# Patient Record
Sex: Female | Born: 1990
Health system: Southern US, Community
[De-identification: ages and names within clinical notes are randomized; demographics above are authoritative.]

## PROBLEM LIST (undated history)

## (undated) ENCOUNTER — Emergency Department (HOSPITAL_COMMUNITY): Admission: EM | Disposition: A | Discharge status: Home / Self Care | Payer: Self-pay

## (undated) DIAGNOSIS — E8729 Other acidosis: Secondary | ICD-10-CM

## (undated) DIAGNOSIS — N390 Urinary tract infection, site not specified: Secondary | ICD-10-CM

## (undated) DIAGNOSIS — B009 Herpesviral infection, unspecified: Secondary | ICD-10-CM

## (undated) DIAGNOSIS — E282 Polycystic ovarian syndrome: Secondary | ICD-10-CM

## (undated) DIAGNOSIS — A599 Trichomoniasis, unspecified: Secondary | ICD-10-CM

## (undated) DIAGNOSIS — E872 Acidosis: Secondary | ICD-10-CM

## (undated) HISTORY — PX: NO PAST SURGERIES: SHX2092

---
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
## 2012-05-08 ENCOUNTER — Emergency Department (INDEPENDENT_AMBULATORY_CARE_PROVIDER_SITE_OTHER)
Admission: EM | Admit: 2012-05-08 | Discharge: 2012-05-08 | Disposition: A | Discharge status: Home / Self Care | Payer: Medicaid Other | Source: Home / Self Care

## 2012-05-08 ENCOUNTER — Encounter (HOSPITAL_COMMUNITY): Payer: Self-pay

## 2012-05-08 DIAGNOSIS — N390 Urinary tract infection, site not specified: Secondary | ICD-10-CM

## 2012-05-08 DIAGNOSIS — J029 Acute pharyngitis, unspecified: Secondary | ICD-10-CM

## 2012-05-08 LAB — POCT URINALYSIS DIP (DEVICE)
Bilirubin Urine: NEGATIVE
Glucose, UA: NEGATIVE mg/dL
Ketones, ur: NEGATIVE mg/dL
Nitrite: NEGATIVE

## 2012-05-08 MED ORDER — AMOXICILLIN-POT CLAVULANATE 875-125 MG PO TABS: 1.0000 | ORAL_TABLET | Freq: Two times a day (BID) | ORAL | Status: DC

## 2012-05-08 MED ORDER — AMOXICILLIN 500 MG PO CAPS: 500.0000 mg | ORAL_CAPSULE | Freq: Three times a day (TID) | ORAL | Status: DC

## 2012-05-08 NOTE — Discharge Instructions (Signed)
Plenty of cool fluids. Augmentin for 10 days as directed Cepacol lozenges as needed for soothing relief Ibuprofen 400-600mg  q 6 hours as needed for discomfort

## 2012-05-08 NOTE — ED Provider Notes (Signed)
Medical screening examination/treatment/procedure(s) were performed by resident physician or non-physician practitioner and as supervising physician I was immediately available for consultation/collaboration.   Barkley Bruns MD.    Linna Hoff, MD 05/08/12 [phone]

## 2012-05-08 NOTE — ED Notes (Signed)
Patient states that she has swollen lymph nodes since 10/13 , also c/o urinary sx painful and frequent urination

## 2012-05-08 NOTE — ED Provider Notes (Addendum)
History     CSN: [id number]  Arrival date & time 05/08/12  1422   None     No chief complaint on file.   (Consider location/radiation/quality/duration/timing/severity/associated sxs/prior treatment) HPI Comments: 21 year old female presents with mild intermittent sore throat for one day. She states she feels knots in her throat and it hurts to swallow. She denies fever or chills. After she was discharged for a exudative pharyngitis the nurse came back and told me that she had several other complaints as well. One of which was tingling when she urinates. Denies frequency or dysuria. C/O generalized, vague abdominal discomfort as well as valvular discomfort. According to her past medical history she has a history of HSV and trich.   Past Medical History  Diagnosis Date  . Diabetes mellitus   . PCOS (polycystic ovarian syndrome)   . Urinary tract infection   . Trichimoniasis     No past surgical history on file.  No family history on file.  History  Substance Use Topics  . Smoking status: Never Smoker   . Smokeless tobacco: Not on file  . Alcohol Use: No    OB History    Grav Para Term Preterm Abortions TAB SAB Ect Mult Living                  Review of Systems  Constitutional: Positive for appetite change. Negative for fever, chills, activity change and fatigue.  HENT: Positive for congestion, sore throat, rhinorrhea and postnasal drip. Negative for facial swelling, neck pain and neck stiffness.   Eyes: Negative.   Respiratory: Negative.   Cardiovascular: Negative.   Genitourinary: Positive for dysuria and frequency.  Musculoskeletal: Negative.   Skin: Negative for pallor and rash.  Neurological: Negative.     Allergies  Review of patient's allergies indicates no known allergies.  Home Medications   Current Outpatient Rx  Name Route Sig Dispense Refill  . AMOXICILLIN-POT CLAVULANATE 875-125 MG PO TABS Oral Take 1 tablet by mouth every 12 (twelve) hours. 14  tablet 0  . GLIPIZIDE ER 2.5 MG PO TB24 Oral Take 2.5 mg by mouth daily.    Marland Kitchen METFORMIN HCL 850 MG PO TABS Oral Take 0.5 tablets (425 mg total) by mouth 2 (two) times daily with a meal. 60 tablet 0  . METFORMIN HCL 850 MG PO TABS Oral Take 1 tablet (850 mg total) by mouth 2 (two) times daily with a meal. 60 tablet 0  . METFORMIN HCL 850 MG PO TABS Oral Take 1 tablet (850 mg total) by mouth 2 (two) times daily with a meal. 60 tablet 0  . METRONIDAZOLE 500 MG PO TABS Oral Take 2,000 mg by mouth once. One time dose for trichimonis      BP 152/92  Pulse 93  Temp 98.2 F (36.8 C) (Oral)  Resp 19  SpO2 100%  Physical Exam  ED Course  Procedures (including critical care time)  Labs Reviewed  POCT URINALYSIS DIP (DEVICE) - Abnormal; Notable for the following:    Hgb urine dipstick TRACE (*)     Leukocytes, UA MODERATE (*)  Biochemical Testing Only. Please order routine urinalysis from main lab if confirmatory testing is needed.   All other components within normal limits   No results found.   1. Exudative pharyngitis   2. UTI (lower urinary tract infection)       MDM  Augmentin 875 one twice a day for 10 days. Drink plenty of clear cool liquids for hydration Cepacol lozenges  when necessary for soothing relief. Ibuprofen 400-600 mg every 6 hours when necessary pain The patient was advised that we would be able see her for her sore throat and lymph nodes today and we'll check a urine for urinary tract infections however or other complaints of intermittent abdominal discomfort and vulvovaginal discomfort can be worked up at another time.  Results for orders placed during the hospital encounter of 05/08/12  POCT URINALYSIS DIP (DEVICE)      Component Value Range   Glucose, UA NEGATIVE  NEGATIVE mg/dL   Bilirubin Urine NEGATIVE  NEGATIVE   Ketones, ur NEGATIVE  NEGATIVE mg/dL   Specific Gravity, Urine 1.010  1.005 - 1.030   Hgb urine dipstick TRACE (*) NEGATIVE   pH 5.5  5.0 -  8.0   Protein, ur NEGATIVE  NEGATIVE mg/dL   Urobilinogen, UA 0.2  0.0 - 1.0 mg/dL   Nitrite NEGATIVE  NEGATIVE   Leukocytes, UA MODERATE (*) NEGATIVE         Hayden Rasmussen, NP 05/08/12 1619  Hayden Rasmussen, NP 05/08/12 1621  Hayden Rasmussen, NP 05/08/12 1626  Hayden Rasmussen, NP 05/09/12 2353

## 2012-05-10 NOTE — ED Provider Notes (Signed)
Medical screening examination/treatment/procedure(s) were performed by resident physician or non-physician practitioner and as supervising physician I was immediately available for consultation/collaboration.   Barkley Bruns MD.    Linna Hoff, MD 05/10/12 [phone]

## 2013-01-03 ENCOUNTER — Inpatient Hospital Stay (HOSPITAL_COMMUNITY)
Admission: AD | Admit: 2013-01-03 | Discharge: 2013-01-03 | Disposition: A | Discharge status: Home / Self Care | Payer: Medicaid Other | Source: Ambulatory Visit | Attending: Obstetrics & Gynecology | Admitting: Obstetrics & Gynecology

## 2013-01-03 ENCOUNTER — Encounter (HOSPITAL_COMMUNITY): Payer: Self-pay | Admitting: Family

## 2013-01-03 DIAGNOSIS — B373 Candidiasis of vulva and vagina: Secondary | ICD-10-CM | POA: Insufficient documentation

## 2013-01-03 DIAGNOSIS — Z794 Long term (current) use of insulin: Secondary | ICD-10-CM | POA: Insufficient documentation

## 2013-01-03 DIAGNOSIS — IMO0002 Reserved for concepts with insufficient information to code with codable children: Secondary | ICD-10-CM

## 2013-01-03 DIAGNOSIS — B3731 Acute candidiasis of vulva and vagina: Secondary | ICD-10-CM | POA: Insufficient documentation

## 2013-01-03 DIAGNOSIS — E119 Type 2 diabetes mellitus without complications: Secondary | ICD-10-CM | POA: Insufficient documentation

## 2013-01-03 HISTORY — DX: Acidosis: E87.2

## 2013-01-03 HISTORY — DX: Herpesviral infection, unspecified: B00.9

## 2013-01-03 HISTORY — DX: Other acidosis: E87.29

## 2013-01-03 LAB — WET PREP, GENITAL
Clue Cells Wet Prep HPF POC: NONE SEEN
Trich, Wet Prep: NONE SEEN

## 2013-01-03 LAB — URINALYSIS, ROUTINE W REFLEX MICROSCOPIC
Nitrite: NEGATIVE
Specific Gravity, Urine: <1.005 — ABNORMAL LOW (ref 1.005–1.030)
Urobilinogen, UA: 0.2 mg/dL (ref 0.0–1.0)
pH: 6 (ref 5.0–8.0)

## 2013-01-03 LAB — URINE MICROSCOPIC-ADD ON

## 2013-01-03 MED ORDER — FLUCONAZOLE 150 MG PO TABS
150.0000 mg | ORAL_TABLET | Freq: Once | ORAL | Status: AC
  Administered 2013-01-03: 150 mg via ORAL
  Filled 2013-01-03: qty 1

## 2013-01-03 MED ORDER — INSULIN ASPART PROT & ASPART (70-30 MIX) 100 UNIT/ML ~~LOC~~ SUSP: 22.0000 [IU] | Freq: Every day | SUBCUTANEOUS | Status: DC

## 2013-01-03 MED ORDER — LEVONORGESTREL 1.5 MG PO TABS
1.5000 mg | ORAL_TABLET | Freq: Once | ORAL | Status: AC
  Administered 2013-01-03: 1.5 mg via ORAL
  Filled 2013-01-03: qty 1

## 2013-01-03 MED ORDER — FLUCONAZOLE 150 MG PO TABS: 150.0000 mg | ORAL_TABLET | Freq: Once | ORAL | Status: DC
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
## 2013-01-05 NOTE — MAU Provider Note (Signed)
Attestation of Attending Supervision of Advanced Practitioner (PA/CNM/NP): Evaluation and management procedures were performed by the Advanced Practitioner under my supervision and collaboration.  I have reviewed the Advanced Practitioner's note and chart, and I agree with the management and plan.  Antionne Enrique, MD, FACOG Attending Obstetrician & Gynecologist Faculty Practice, [facility]  

## 2013-01-20 ENCOUNTER — Encounter: Payer: Medicaid Other | Admitting: Obstetrics & Gynecology

## 2014-03-03 ENCOUNTER — Emergency Department (HOSPITAL_COMMUNITY)
Admission: EM | Admit: 2014-03-03 | Discharge: 2014-03-04 | Disposition: A | Discharge status: Home / Self Care | Payer: BC Managed Care – PPO | Attending: Emergency Medicine | Admitting: Emergency Medicine

## 2014-03-03 ENCOUNTER — Encounter (HOSPITAL_COMMUNITY): Payer: Self-pay | Admitting: Emergency Medicine

## 2014-03-03 DIAGNOSIS — Z87440 Personal history of urinary (tract) infections: Secondary | ICD-10-CM | POA: Insufficient documentation

## 2014-03-03 DIAGNOSIS — E119 Type 2 diabetes mellitus without complications: Secondary | ICD-10-CM | POA: Insufficient documentation

## 2014-03-03 DIAGNOSIS — Z79899 Other long term (current) drug therapy: Secondary | ICD-10-CM | POA: Insufficient documentation

## 2014-03-03 DIAGNOSIS — Z3202 Encounter for pregnancy test, result negative: Secondary | ICD-10-CM | POA: Insufficient documentation

## 2014-03-03 DIAGNOSIS — Z8619 Personal history of other infectious and parasitic diseases: Secondary | ICD-10-CM | POA: Insufficient documentation

## 2014-03-03 DIAGNOSIS — R739 Hyperglycemia, unspecified: Secondary | ICD-10-CM

## 2014-03-03 DIAGNOSIS — IMO0002 Reserved for concepts with insufficient information to code with codable children: Secondary | ICD-10-CM | POA: Diagnosis not present

## 2014-03-03 LAB — URINALYSIS, ROUTINE W REFLEX MICROSCOPIC
Bilirubin Urine: NEGATIVE
Hgb urine dipstick: NEGATIVE
Ketones, ur: NEGATIVE mg/dL
Nitrite: NEGATIVE
PROTEIN: NEGATIVE mg/dL
Specific Gravity, Urine: 1.03 (ref 1.005–1.030)
UROBILINOGEN UA: 0.2 mg/dL (ref 0.0–1.0)
pH: 5.5 (ref 5.0–8.0)

## 2014-03-03 LAB — CBC
HCT: 37.1 % (ref 36.0–46.0)
Hemoglobin: 12.5 g/dL (ref 12.0–15.0)
MCH: 26.6 pg (ref 26.0–34.0)
MCHC: 33.7 g/dL (ref 30.0–36.0)
MCV: 78.9 fL (ref 78.0–100.0)
PLATELETS: 220 10*3/uL (ref 150–400)
RBC: 4.7 MIL/uL (ref 3.87–5.11)
RDW: 12.7 % (ref 11.5–15.5)
WBC: 7.9 10*3/uL (ref 4.0–10.5)

## 2014-03-03 LAB — URINE MICROSCOPIC-ADD ON

## 2014-03-03 LAB — CBG MONITORING, ED: GLUCOSE-CAPILLARY: 517 mg/dL — AB (ref 70–99)

## 2014-03-03 MED ORDER — SODIUM CHLORIDE 0.9 % IV BOLUS (SEPSIS)
1000.0000 mL | Freq: Once | INTRAVENOUS | Status: AC
  Administered 2014-03-03: 1000 mL via INTRAVENOUS

## 2014-03-03 NOTE — ED Notes (Signed)
Pt states she stopped taking her insulin in Feb d/t financial and insurance concerns. Pt went to [facility] service who referred her here for hyperglycemia. CBG=412 at [facility].

## 2014-03-03 NOTE — ED Notes (Signed)
Bed: QR97 Expected date:  Expected time:  Means of arrival:  Comments: Triage 1 - CBG=517

## 2014-03-04 LAB — COMPREHENSIVE METABOLIC PANEL
ALT: 13 U/L (ref 0–35)
ANION GAP: 16 — AB (ref 5–15)
AST: 16 U/L (ref 0–37)
Albumin: 4.2 g/dL (ref 3.5–5.2)
Alkaline Phosphatase: 51 U/L (ref 39–117)
BUN: 10 mg/dL (ref 6–23)
CALCIUM: 10 mg/dL (ref 8.4–10.5)
CO2: 22 meq/L (ref 19–32)
CREATININE: 0.74 mg/dL (ref 0.50–1.10)
Chloride: 96 mEq/L (ref 96–112)
Glucose, Bld: 577 mg/dL (ref 70–99)
Potassium: 4.2 mEq/L (ref 3.7–5.3)
Sodium: 134 mEq/L — ABNORMAL LOW (ref 137–147)
Total Bilirubin: 0.3 mg/dL (ref 0.3–1.2)
Total Protein: 8.2 g/dL (ref 6.0–8.3)

## 2014-03-04 LAB — BLOOD GAS, VENOUS
Acid-base deficit: 1.5 mmol/L (ref 0.0–2.0)
Bicarbonate: 24.5 mEq/L — ABNORMAL HIGH (ref 20.0–24.0)
FIO2: 0.21 %
O2 SAT: 50.1 %
PATIENT TEMPERATURE: 99
TCO2: 22.6 mmol/L (ref 0–100)
pCO2, Ven: 49.7 mmHg (ref 45.0–50.0)
pH, Ven: 7.316 — ABNORMAL HIGH (ref 7.250–7.300)
pO2, Ven: 30.3 mmHg (ref 30.0–45.0)

## 2014-03-04 LAB — CBG MONITORING, ED
GLUCOSE-CAPILLARY: 181 mg/dL — AB (ref 70–99)
Glucose-Capillary: 478 mg/dL — ABNORMAL HIGH (ref 70–99)

## 2014-03-04 MED ORDER — INSULIN ASPART 100 UNIT/ML ~~LOC~~ SOLN
SUBCUTANEOUS | Status: AC
  Filled 2014-03-04: qty 1

## 2014-03-04 MED ORDER — SODIUM CHLORIDE 0.9 % IV BOLUS (SEPSIS): 1000.0000 mL | Freq: Once | INTRAVENOUS | Status: DC

## 2014-03-04 NOTE — ED Provider Notes (Signed)
Darlene Watts S 1:00 AM patient discussed in sign out. Patient with previous history of diabetes presenting with concerns for elevated blood sugar. Patient has not been on her regular insulin due to lack of insurance. Was sent from [facility] with concern and worry of elevated blood sugar. Blood sugar here over 500. No signs of DKA. Treatment initiated we will plan to recheck blood sugars and if improved may discharge home with prescriptions for her normal NovoLog insulin.   Blood sugars have improved significantly and are now 181. Patient continues to be without significant symptoms. She may be discharged at this time.  Martie Lee, PA-C 03/04/14 [phone]

## 2014-03-04 NOTE — ED Notes (Signed)
Patient discharged during downtime. See downtime documentation.

## 2014-03-04 NOTE — ED Provider Notes (Signed)
Medical screening examination/treatment/procedure(s) were performed by non-physician practitioner and as supervising physician I was immediately available for consultation/collaboration.   Delora Fuel, MD 29/52/84 1324

## 2014-03-04 NOTE — ED Provider Notes (Signed)
CSN: [id number]     Arrival date & time 03/03/14  2159 History   First MD Initiated Contact with Patient 03/03/14 2243     Chief Complaint  Patient presents with  . Hyperglycemia     (Consider location/radiation/quality/duration/timing/severity/associated sxs/prior Treatment) The history is provided by the patient and medical records.   This is a 23 y.o. F with hx of DM2, HSV2, PCOS, presenting to the ED for hyperglycemia.  Patient states she has been off of her insulin for the past 6 months, was previously on novo-log 70-30, but could no longer afford medications when she lost her insurance.  States when taking her insulin, her blood sugar was well controlled.  Recently she has been experiencing excessive thirst and frequent urination without dysuria.  She denies fever, chills, nausea, vomiting, diarrhea.  She has had frequent yeast infections since being off medications, [facility].  Her blood sugar was checked at that time, found to be 412 thus she was sent to the ED for further evaluation. VS stable on arrival.  Past Medical History  Diagnosis Date  . PCOS (polycystic ovarian syndrome)   . Urinary tract infection   . Trichimoniasis   . Diabetes mellitus     Type 2 - Novolog 70/30  . Herpes simplex type 2 infection March 2013  . Ketoacidosis    Past Surgical History  Procedure Laterality Date  . No past surgeries     No family history on file. History  Substance Use Topics  . Smoking status: Never Smoker   . Smokeless tobacco: Never Used  . Alcohol Use: No   OB History   Grav Para Term Preterm Abortions TAB SAB Ect Mult Living                 Review of Systems  Endocrine: Positive for polydipsia and polyuria.  All other systems reviewed and are negative.     Allergies  Review of patient's allergies indicates no known allergies.  Home Medications   Prior to Admission medications   Medication  Sig Start Date End Date Taking? Authorizing Provider  fluticasone (FLONASE) 50 MCG/ACT nasal spray Place 2 sprays into both nostrils daily.   Yes Historical Provider, MD  loratadine (CLARITIN) 10 MG tablet Take 10 mg by mouth daily.   Yes Historical Provider, MD  miconazole (MICOTIN) 2 % powder Apply 1 application topically daily as needed for itching.   Yes Historical Provider, MD   BP 120/69  Pulse 78  Temp(Src) 99 F (37.2 C) (Oral)  Resp 16  SpO2 100%  LMP 02/19/2014  Physical Exam  Nursing note and vitals reviewed. Constitutional: She is oriented to person, place, and time. She appears well-developed and well-nourished. No distress.  NAD, currently drinking water  HENT:  Head: Normocephalic and atraumatic.  Mouth/Throat: Oropharynx is clear and moist.  Eyes: Conjunctivae and EOM are normal. Pupils are equal, round, and reactive to light.  Neck: Normal range of motion. Neck supple.  Cardiovascular: Normal rate, regular rhythm and normal heart sounds.   Pulmonary/Chest: Effort normal and breath sounds normal. No respiratory distress. She has no wheezes.  Abdominal: Soft. Bowel sounds are normal. There is no tenderness. There is no guarding.  Musculoskeletal: Normal range of motion.  Neurological: She is alert and oriented to person, place, and time.  Skin: Skin is warm and dry. She is not diaphoretic.  Psychiatric: She has a normal mood and affect.  ED Course  Procedures (including critical care time) Labs Review Labs Reviewed  COMPREHENSIVE METABOLIC PANEL - Abnormal; Notable for the following:    Sodium 134 (*)    Glucose, Bld 577 (*)    Anion gap 16 (*)    All other components within normal limits  URINALYSIS, ROUTINE W REFLEX MICROSCOPIC - Abnormal; Notable for the following:    Glucose, UA >1000 (*)    Leukocytes, UA TRACE (*)    All other components within normal limits  BLOOD GAS, VENOUS - Abnormal; Notable for the following:    pH, Ven 7.316 (*)     Bicarbonate 24.5 (*)    All other components within normal limits  CBG MONITORING, ED - Abnormal; Notable for the following:    Glucose-Capillary 517 (*)    All other components within normal limits  CBG MONITORING, ED - Abnormal; Notable for the following:    Glucose-Capillary 478 (*)    All other components within normal limits  CBG MONITORING, ED - Abnormal; Notable for the following:    Glucose-Capillary 181 (*)    All other components within normal limits  CBC  URINE MICROSCOPIC-ADD ON  POC URINE PREG, ED    Imaging Review No results found.   EKG Interpretation None      MDM   Final diagnoses:  Hyperglycemia   23 y.o. Type 2 diabetic off insulin for past several months, presenting with hyperglycemia.  CBG on arrival was 517.  Patient awake, alert, currently drinking water in NAD.  Labs were obtained, anion gap of 16, bicarb WNL, no ketones present in urine-- clinically not DKA.  Patient given 1L of fluid with CBG  Improved to 478.  12 units insulin ordered and second liter infusing.    Care signed out to PA Dammen at shift change.  Feel patient can be discharged home with Rx for home insulin (was on 15-20 units of novo-log 70-30) once CBG controlled.  Larene Pickett, PA-C 03/04/14 1117

## 2014-03-07 NOTE — ED Provider Notes (Signed)
Medical screening examination/treatment/procedure(s) were performed by non-physician practitioner and as supervising physician I was immediately available for consultation/collaboration.   EKG Interpretation None        Debby Freiberg, MD 03/07/14 1505

## 2014-09-14 ENCOUNTER — Encounter (HOSPITAL_COMMUNITY): Payer: Self-pay | Admitting: *Deleted

## 2014-09-14 ENCOUNTER — Emergency Department (HOSPITAL_COMMUNITY)
Admission: EM | Admit: 2014-09-14 | Discharge: 2014-09-14 | Disposition: A | Discharge status: Home / Self Care | Payer: BLUE CROSS/BLUE SHIELD | Attending: Emergency Medicine | Admitting: Emergency Medicine

## 2014-09-14 DIAGNOSIS — Z3202 Encounter for pregnancy test, result negative: Secondary | ICD-10-CM | POA: Insufficient documentation

## 2014-09-14 DIAGNOSIS — Z880 Allergy status to penicillin: Secondary | ICD-10-CM | POA: Insufficient documentation

## 2014-09-14 DIAGNOSIS — E1165 Type 2 diabetes mellitus with hyperglycemia: Secondary | ICD-10-CM | POA: Insufficient documentation

## 2014-09-14 DIAGNOSIS — Z79899 Other long term (current) drug therapy: Secondary | ICD-10-CM | POA: Diagnosis not present

## 2014-09-14 DIAGNOSIS — Z8619 Personal history of other infectious and parasitic diseases: Secondary | ICD-10-CM | POA: Insufficient documentation

## 2014-09-14 DIAGNOSIS — N390 Urinary tract infection, site not specified: Secondary | ICD-10-CM

## 2014-09-14 DIAGNOSIS — R739 Hyperglycemia, unspecified: Secondary | ICD-10-CM

## 2014-09-14 DIAGNOSIS — R300 Dysuria: Secondary | ICD-10-CM | POA: Diagnosis present

## 2014-09-14 LAB — CBC
HEMATOCRIT: 40.2 % (ref 36.0–46.0)
Hemoglobin: 13.8 g/dL (ref 12.0–15.0)
MCH: 27.2 pg (ref 26.0–34.0)
MCHC: 34.3 g/dL (ref 30.0–36.0)
MCV: 79.1 fL (ref 78.0–100.0)
Platelets: 257 10*3/uL (ref 150–400)
RBC: 5.08 MIL/uL (ref 3.87–5.11)
RDW: 12.3 % (ref 11.5–15.5)
WBC: 8.1 10*3/uL (ref 4.0–10.5)

## 2014-09-14 LAB — URINALYSIS, ROUTINE W REFLEX MICROSCOPIC
Bilirubin Urine: NEGATIVE
KETONES UR: 40 mg/dL — AB
Nitrite: NEGATIVE
Protein, ur: NEGATIVE mg/dL
SPECIFIC GRAVITY, URINE: 1.026 (ref 1.005–1.030)
Urobilinogen, UA: 0.2 mg/dL (ref 0.0–1.0)
pH: 5.5 (ref 5.0–8.0)

## 2014-09-14 LAB — COMPREHENSIVE METABOLIC PANEL
ALT: 13 U/L (ref 0–35)
AST: 12 U/L (ref 0–37)
Albumin: 4.5 g/dL (ref 3.5–5.2)
Alkaline Phosphatase: 62 U/L (ref 39–117)
Anion gap: 15 (ref 5–15)
BILIRUBIN TOTAL: 1.1 mg/dL (ref 0.3–1.2)
BUN: 13 mg/dL (ref 6–23)
CHLORIDE: 92 mmol/L — AB (ref 96–112)
CO2: 26 mmol/L (ref 19–32)
CREATININE: 0.83 mg/dL (ref 0.50–1.10)
Calcium: 10.2 mg/dL (ref 8.4–10.5)
GFR calc Af Amer: >90 mL/min (ref >90)
GFR calc non Af Amer: >90 mL/min (ref >90)
Glucose, Bld: 471 mg/dL — ABNORMAL HIGH (ref 70–99)
Potassium: 4.6 mmol/L (ref 3.5–5.1)
SODIUM: 133 mmol/L — AB (ref 135–145)
Total Protein: 8.7 g/dL — ABNORMAL HIGH (ref 6.0–8.3)

## 2014-09-14 LAB — BLOOD GAS, VENOUS
ACID-BASE EXCESS: 2.6 mmol/L — AB (ref 0.0–2.0)
BICARBONATE: 28.3 meq/L — AB (ref 20.0–24.0)
FIO2: 0.21 %
O2 SAT: 38.4 %
PATIENT TEMPERATURE: 98.6
PCO2 VEN: 50 mmHg (ref 45.0–50.0)
TCO2: 25.5 mmol/L (ref 0–100)
pH, Ven: 7.372 — ABNORMAL HIGH (ref 7.250–7.300)

## 2014-09-14 LAB — URINE MICROSCOPIC-ADD ON

## 2014-09-14 LAB — CBG MONITORING, ED
GLUCOSE-CAPILLARY: 369 mg/dL — AB (ref 70–99)
Glucose-Capillary: 515 mg/dL — ABNORMAL HIGH (ref 70–99)
Glucose-Capillary: 333 mg/dL — ABNORMAL HIGH (ref 70–99)
Glucose-Capillary: 274 mg/dL — ABNORMAL HIGH (ref 70–99)

## 2014-09-14 LAB — POC URINE PREG, ED: Preg Test, Ur: NEGATIVE

## 2014-09-14 MED ORDER — HYDROMORPHONE HCL 1 MG/ML IJ SOLN
1.0000 mg | Freq: Once | INTRAMUSCULAR | Status: AC
  Administered 2014-09-14: 1 mg via INTRAVENOUS
  Filled 2014-09-14: qty 1

## 2014-09-14 MED ORDER — SODIUM CHLORIDE 0.9 % IV BOLUS (SEPSIS)
1000.0000 mL | Freq: Once | INTRAVENOUS | Status: AC
  Administered 2014-09-14: 1000 mL via INTRAVENOUS

## 2014-09-14 MED ORDER — PHENAZOPYRIDINE HCL 200 MG PO TABS: 200.0000 mg | ORAL_TABLET | Freq: Three times a day (TID) | ORAL | Status: DC

## 2014-09-14 MED ORDER — METOCLOPRAMIDE HCL 5 MG/ML IJ SOLN
10.0000 mg | Freq: Once | INTRAMUSCULAR | Status: AC
  Administered 2014-09-14: 10 mg via INTRAVENOUS
  Filled 2014-09-14: qty 2

## 2014-09-14 MED ORDER — ONDANSETRON HCL 4 MG PO TABS: 4.0000 mg | ORAL_TABLET | Freq: Four times a day (QID) | ORAL | Status: DC

## 2014-09-14 MED ORDER — CIPROFLOXACIN IN D5W 400 MG/200ML IV SOLN
400.0000 mg | Freq: Once | INTRAVENOUS | Status: AC
  Administered 2014-09-14: 400 mg via INTRAVENOUS
  Filled 2014-09-14: qty 200

## 2014-09-14 MED ORDER — SODIUM CHLORIDE 0.9 % IV SOLN
INTRAVENOUS | Status: DC
  Administered 2014-09-14: 13:00:00 via INTRAVENOUS
  Filled 2014-09-14: qty 2.5

## 2014-09-14 MED ORDER — CIPROFLOXACIN HCL 500 MG PO TABS: 500.0000 mg | ORAL_TABLET | Freq: Two times a day (BID) | ORAL | Status: DC

## 2014-09-14 NOTE — ED Notes (Signed)
Per Dr Olevia Bowens at [facility] has been self-medicating with Metformin-noncompliant with DM treatment/management-seen in his office today-for posible pyelo and a CBG of 512-patient symptomatic, c/o back pain, dysuria, lethargy

## 2014-09-14 NOTE — ED Provider Notes (Signed)
CSN: [id number]     Arrival date & time 09/14/14  1027 History   First MD Initiated Contact with Patient 09/14/14 1116     Chief Complaint  Patient presents with  . eval for pyelonephritis and DKA      (Consider location/radiation/quality/duration/timing/severity/associated sxs/prior Treatment) HPI  Pt presenting with c/o elevated blood glucose and dysuria.  She has hx of DM and states she has been taking only metformin for the past 6-8 months as she could not afford her insulin and had lost insurance.  She states that approx 2 days ago she began having some dysuria and stinging with urination.  She checked her glucose and it has been ranging 200-400.  States she hadn't checked prior to this because she was feeling well.  No fever/chills.  Had one episode of emesis prior to arrival.  Was seen at her PMD office and referred to the ED due to concern for UTI in the setting of DKA.  Pt denies back pain, no abdominal pain.  There are no other associated systemic symptoms, there are no other alleviating or modifying factors.   Past Medical History  Diagnosis Date  . PCOS (polycystic ovarian syndrome)   . Urinary tract infection   . Trichimoniasis   . Diabetes mellitus     Type 2 - Novolog 70/30  . Herpes simplex type 2 infection March 2013  . Ketoacidosis    Past Surgical History  Procedure Laterality Date  . No past surgeries     History reviewed. No pertinent family history. History  Substance Use Topics  . Smoking status: Never Smoker   . Smokeless tobacco: Never Used  . Alcohol Use: No   OB History    No data available     Review of Systems  ROS reviewed and all otherwise negative except for mentioned in HPI    Allergies  Amoxicillin  Home Medications   Prior to Admission medications   Medication Sig Start Date End Date Taking? Authorizing Provider  Ascorbic Acid (VITAMIN C PO) Take 3-4 tablets by mouth daily as needed (For cold symptoms.).   Yes Historical Provider,  MD  loratadine (CLARITIN) 10 MG tablet Take 10 mg by mouth daily as needed for allergies.    Yes Historical Provider, MD  metFORMIN (GLUCOPHAGE) 1000 MG tablet Take 1,000 mg by mouth 2 (two) times daily with a meal.   Yes Historical Provider, MD  miconazole (MICOTIN) 2 % powder Apply 1 application topically 2 (two) times daily.    Yes Historical Provider, MD  ciprofloxacin (CIPRO) 500 MG tablet Take 1 tablet (500 mg total) by mouth 2 (two) times daily. 09/14/14   Threasa Beards, MD  ondansetron (ZOFRAN) 4 MG tablet Take 1 tablet (4 mg total) by mouth every 6 (six) hours. 09/14/14   Threasa Beards, MD  phenazopyridine (PYRIDIUM) 200 MG tablet Take 1 tablet (200 mg total) by mouth 3 (three) times daily. 09/14/14   Threasa Beards, MD   BP 132/78 mmHg  Pulse 91  Temp(Src) 98.2 F (36.8 C) (Oral)  Resp 16  SpO2 100%  Vitals reviewed Physical Exam  Physical Examination: General appearance - alert, well appearing, and in no distress Mental status - alert, oriented to person, place, and time Eyes - no conjunctival injection, no scleral icterus Mouth - mucous membranes tacky, OP clear Chest - clear to auscultation, no wheezes, rales or rhonchi, symmetric air entry Heart - normal rate, regular rhythm, normal S1, S2, no murmurs, rubs, clicks  or gallops Abdomen - soft, nontender, nondistended, no masses or organomegaly, nabs Back exam - full range of motion, no tenderness, palpable spasm or pain on motion Extremities - peripheral pulses normal, no pedal edema, no clubbing or cyanosis Skin - normal coloration and turgor, no rashes  ED Course  Procedures (including critical care time)  11:37 AM went to see patient, not in room Labs Review Labs Reviewed  COMPREHENSIVE METABOLIC PANEL - Abnormal; Notable for the following:    Sodium 133 (*)    Chloride 92 (*)    Glucose, Bld 471 (*)    Total Protein 8.7 (*)    All other components within normal limits  URINALYSIS, ROUTINE W REFLEX MICROSCOPIC  - Abnormal; Notable for the following:    APPearance CLOUDY (*)    Glucose, UA >1000 (*)    Hgb urine dipstick SMALL (*)    Ketones, ur 40 (*)    Leukocytes, UA MODERATE (*)    All other components within normal limits  BLOOD GAS, VENOUS - Abnormal; Notable for the following:    pH, Ven 7.372 (*)    Bicarbonate 28.3 (*)    Acid-Base Excess 2.6 (*)    All other components within normal limits  URINE MICROSCOPIC-ADD ON - Abnormal; Notable for the following:    Bacteria, UA FEW (*)    All other components within normal limits  CBG MONITORING, ED - Abnormal; Notable for the following:    Glucose-Capillary 515 (*)    All other components within normal limits  CBG MONITORING, ED - Abnormal; Notable for the following:    Glucose-Capillary 369 (*)    All other components within normal limits  CBG MONITORING, ED - Abnormal; Notable for the following:    Glucose-Capillary 333 (*)    All other components within normal limits  CBG MONITORING, ED - Abnormal; Notable for the following:    Glucose-Capillary 274 (*)    All other components within normal limits  CBG MONITORING, ED - Abnormal; Notable for the following:    Glucose-Capillary 333 (*)    All other components within normal limits  URINE CULTURE  CBC  POC URINE PREG, ED    Imaging Review No results found.   EKG Interpretation None      MDM   Final diagnoses:  UTI (lower urinary tract infection)  Hyperglycemia    Pt presenting with c/o dysuria, blood sugar elevated.  She is not in DKA based on labwork, she is hyperglcyemic.  Pt treated with insulin to correct her gulcose.  Started on cipro for UTI- hx of hives with amox so started on cipro.  Urine culture sent.  Discharged with strict return precautions.  Pt agreeable with plan.  She will followup with student health at [facility]- she will work with them to get set up with an endocrinologist.  Pt has tolerated po fluids in the ED.     Threasa Beards, MD 09/16/14 1536

## 2014-09-14 NOTE — ED Notes (Addendum)
Pt reports seen at doctors office, hx of DM, has not taken insulin for DM in 8 months due to lapse in insurance. Reports dysuria x5 days. Pain 7/10. Hx of admission for DKA. Results show UA: 3+ leuks, positive nitrates, 40 ketones, 2000 glucose, micro TNTC WBCS. Urine hcg negative. Cbc with WBC #9.2, random blood glucose 512, HCB A1C> 14. "concern for DKA in setting of pyelonephritis."

## 2014-09-14 NOTE — Discharge Instructions (Signed)
Return to the ED with any concerns including vomiting and not able to keep down liquids, fever/chills, fainting, shortness of breath, decreased level of alertness/lethargy, or any other alarming symptoms  You should be sure to check your blood sugar regularly, especially while you have the urinary tract infection

## 2014-09-14 NOTE — ED Notes (Signed)
Pt is aware that a urine sample is needed.  

## 2014-09-15 LAB — CBG MONITORING, ED: Glucose-Capillary: 333 mg/dL — ABNORMAL HIGH (ref 70–99)

## 2014-09-17 LAB — URINE CULTURE

## 2014-09-19 ENCOUNTER — Telehealth: Payer: Self-pay | Admitting: *Deleted

## 2014-09-19 NOTE — ED Notes (Unsigned)
(+)  urine culture, treated with Cipro, no further treatment, J. Judie Bonus, Pharm

## 2014-10-06 ENCOUNTER — Ambulatory Visit: Payer: BLUE CROSS/BLUE SHIELD | Admitting: Internal Medicine

## 2014-10-30 ENCOUNTER — Ambulatory Visit (INDEPENDENT_AMBULATORY_CARE_PROVIDER_SITE_OTHER): Payer: BLUE CROSS/BLUE SHIELD | Admitting: Internal Medicine

## 2014-10-30 ENCOUNTER — Encounter: Payer: Self-pay | Admitting: Internal Medicine

## 2014-10-30 VITALS — BP 112/78 | HR 83 | Temp 97.6°F | Resp 12 | Ht 62.5 in | Wt 174.0 lb

## 2014-10-30 DIAGNOSIS — E1310 Other specified diabetes mellitus with ketoacidosis without coma: Secondary | ICD-10-CM

## 2014-10-30 DIAGNOSIS — E1165 Type 2 diabetes mellitus with hyperglycemia: Secondary | ICD-10-CM | POA: Insufficient documentation

## 2014-10-30 DIAGNOSIS — E1110 Type 2 diabetes mellitus with ketoacidosis without coma: Secondary | ICD-10-CM

## 2014-10-30 LAB — LIPID PANEL
Cholesterol: 150 mg/dL (ref 0–200)
HDL: 35.5 mg/dL — ABNORMAL LOW (ref >39.00)
LDL Cholesterol: 91 mg/dL (ref 0–99)
NONHDL: 114.5
Total CHOL/HDL Ratio: 4
Triglycerides: 116 mg/dL (ref 0.0–149.0)
VLDL: 23.2 mg/dL (ref 0.0–40.0)

## 2014-10-30 LAB — MICROALBUMIN / CREATININE URINE RATIO
Creatinine,U: 55.5 mg/dL
MICROALB/CREAT RATIO: 1.3 mg/g (ref 0.0–30.0)

## 2014-10-30 LAB — HEMOGLOBIN A1C: Hgb A1c MFr Bld: 13.8 % — ABNORMAL HIGH (ref 4.6–6.5)

## 2014-10-30 MED ORDER — GLIPIZIDE 10 MG PO TABS: 10.0000 mg | ORAL_TABLET | Freq: Two times a day (BID) | ORAL | Status: DC

## 2014-10-30 NOTE — Progress Notes (Signed)
Patient ID: Darlene Watts, female   DOB: 12-23-1990, 24 y.o.   MRN: [id number]  HPI: Darlene Watts is a 24 y.o.-year-old female, referred by her PCP, Dr. Osker Mason [facility]), for management of DM - unclear if 1 or 2, dx in 2011, insulin-dependent, uncontrolled, with complications (DKA in 6433). She was seeing endocrinology in [place] (Dr. Christena Deem).   Last hemoglobin A1c was: - pt does not remember, and records not available  Pt is on a regimen of: - Metformin 1000 mg 2x a day, with meals - Lantus 30 units at bedtime - started 10/15/2014 She was on NovoLog 70/30 before - could not afford it and had to stop - since then changed insurance  Pt checks her sugars 2x a day and they are: - am: 450s before insulin; 200-350 - 2h after b'fast: n/c - before lunch: n/c - 2h after lunch: n/c - before dinner: n/c - at dinner: 200-250 - bedtime: n/c - nighttime: n/c No lows. Lowest sugar was 200; she has hypoglycemia awareness at <120.  Highest sugar was rarely HI.  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   - no CKD, last BUN/creatinine:  Lab Results  Component Value Date   BUN 13 09/14/2014   CREATININE 0.83 09/14/2014   - last set of lipids: No results found for: CHOL, HDL, LDLCALC, LDLDIRECT, TRIG, CHOLHDL - last eye exam was in 2014. No DR.  - no numbness and tingling in her feet.  Pt has FH of DM in mother, MGM, MGF, father.  ROS: Constitutional: + weight loss, + fatigue, no subjective hyperthermia/hypothermia, + excessive urination and nocturia, + poor sleep Eyes: no blurry vision, no xerophthalmia ENT: no sore throat, no nodules palpated in throat, no dysphagia/odynophagia, no hoarseness Cardiovascular: + CP/no  SOB/palpitations/leg swelling Respiratory: no cough/SOB Gastrointestinal: + all: N/V/D/acid reflux, no C Musculoskeletal: no muscle/joint aches Skin: no rashes, + itching Neurological: no tremors/numbness/tingling/dizziness, + HA Psychiatric: no depression/anxiety  Past Medical History  Diagnosis Date  . PCOS (polycystic ovarian syndrome)   . Urinary tract infection   . Trichimoniasis   . Diabetes mellitus     Type 2 - Novolog 70/30  . Herpes simplex type 2 infection March 2013  . Ketoacidosis    Past Surgical History  Procedure Laterality Date  . No past surgeries     History   Social History  . Marital Status: Single    Spouse Name: N/A  . Number of Children: 0   Occupational History  .    Social History Main Topics  . Smoking status: Never Smoker   . Smokeless tobacco: Never Used  . Alcohol Use: No  . Drug Use: No  . Sexual Activity: Yes    Birth Control/ Protection: None   Current Outpatient Prescriptions on File Prior to Visit  Medication Sig Dispense Refill  . Ascorbic Acid (VITAMIN C PO) Take 3-4 tablets by mouth daily as needed (For cold symptoms.).    Marland Kitchen metFORMIN (GLUCOPHAGE) 1000 MG tablet Take 1,000 mg by mouth 2 (two) times daily with a meal.    . miconazole (MICOTIN) 2 % powder Apply 1 application topically 2 (two) times daily.     . ciprofloxacin (CIPRO)  500 MG tablet Take 1 tablet (500 mg total) by mouth 2 (two) times daily. (Patient not taking: Reported on 10/30/2014) 20 tablet 0  . loratadine (CLARITIN) 10 MG tablet Take 10 mg by mouth daily as needed for allergies.     Marland Kitchen ondansetron (ZOFRAN) 4 MG tablet Take 1 tablet (4 mg total) by mouth every 6 (six) hours. (Patient not taking: Reported on 10/30/2014) 12 tablet 0  . phenazopyridine (PYRIDIUM) 200 MG tablet Take 1 tablet (200 mg total) by mouth 3 (three) times daily. (Patient not taking: Reported on 10/30/2014) 6 tablet 0   No current facility-administered medications on file prior to visit.    Allergies  Allergen Reactions  . Amoxicillin Hives   No family history on file.  PE: BP 112/78 mmHg  Pulse 83  Temp(Src) 97.6 F (36.4 C) (Oral)  Resp 12  Ht 5' 2.5" (1.588 m)  Wt 174 lb (78.926 kg)  BMI 31.30 kg/m2  SpO2 99% Wt Readings from Last 3 Encounters:  10/30/14 174 lb (78.926 kg)  01/03/13 219 lb 2 oz (99.394 kg)  09/21/11 220 lb (99.791 kg) (99 %*, Z = 2.20)   * Growth percentiles are based on CDC 2-20 Years data.   Constitutional: overweight, in NAD Eyes: PERRLA, EOMI, no exophthalmos ENT: moist mucous membranes, no thyromegaly, no cervical lymphadenopathy Cardiovascular: RRR, No MRG Respiratory: CTA B Gastrointestinal: abdomen soft, NT, ND, BS+ Musculoskeletal: no deformities, strength intact in all 4 Skin: moist, warm, no rashes, + acanthosis nigricans on neck Neurological: no tremor with outstretched hands, DTR normal in all 4  ASSESSMENT: 1. DM - unclear if 1 or 2, insulin-dependent, uncontrolled, with complications (DKA)  PLAN:  1. Patient with long-standing, uncontrolled diabetes, on oral antidiabetic regimen + basal insulin, which is not sufficient. I believe she has DM1 - we will check anti-pancreatic antibodies today, but cannot check a C-peptide yet as sugars very high, Will check this at next visit. For now, will increase Lantus, continue Metformin and add Glipizide 2x a day. If no improvement >> needs mealtime insulin, most likely. - We discussed about options for treatment, and I suggested to:  Patient Instructions  Please increase Lantus to 35 and then 40 units at bedtime. Continue Metformin 1000 mg 2x a day with meals. Start Glipizide 10 mg 2x a day, before meals.  Please stop at the lab.  Please return in 1 month with your sugar log.   - continue sugars at different times of the day - check 2-3imes a day, rotating checks - given sugar log and advised how to fill it and to bring it at next appt  - given foot care handout and explained  the principles  - given instructions for hypoglycemia management "15-15 rule"  - advised for yearly eye exams >> needs one! - check hbA1c, Lipids (she is fasting) and ACR today - Return to clinic in 1 mo with sugar log   Office Visit on 10/30/2014  Component Date Value Ref Range Status  . Hgb A1c MFr Bld 10/30/2014 13.8* 4.6 - 6.5 % Final   Glycemic Control Guidelines for People with Diabetes:Non Diabetic:  <6%Goal of Therapy: <7%Additional Action Suggested:  >8%   . Microalb, Ur 10/30/2014 <0.7  0.0 - 1.9 mg/dL Final  . Creatinine,U 10/30/2014 55.5   Final  . Microalb Creat Ratio 10/30/2014 1.3  0.0 - 30.0 mg/g Final  . Cholesterol 10/30/2014 150  0 - 200 mg/dL Final   ATP III Classification  Desirable:  < 200 mg/dL               Borderline High:  200 - 239 mg/dL          High:  > = 240 mg/dL  . Triglycerides 10/30/2014 116.0  0.0 - 149.0 mg/dL Final   Normal:  <150 mg/dLBorderline High:  150 - 199 mg/dL  . HDL 10/30/2014 35.50* >39.00 mg/dL Final  . VLDL 10/30/2014 23.2  0.0 - 40.0 mg/dL Final  . LDL Cholesterol 10/30/2014 91  0 - 99 mg/dL Final  . Total CHOL/HDL Ratio 10/30/2014 4   Final                  Men          Women1/2 Average Risk     3.4          3.3Average Risk          5.0          4.42X Average Risk          9.6          7.13X Average Risk          15.0          11.0                      . NonHDL 10/30/2014 114.50   Final   NOTE:  Non-HDL goal should be 30 mg/dL higher than patient's LDL goal (i.e. LDL goal of < 70 mg/dL, would have non-HDL goal of < 100 mg/dL)   Component     Latest Ref Rng 10/30/2014  Glutamic Acid Decarb Ab     <=1.0 U/mL 3.8 (H)  Pancreatic Islet Cell Antibody     < 5 JDF Units <5   Patient with high GAD antibodies, and normal ICA antibodies level. I will continue her insulin for now along with oral medicines, however, at next visit, we need a C-peptide. If this is normal or high, I will consider that she has ketone prone diabetes and continue  to manage her with the current regimen. If her C-peptide is low, I will consider that she has type 1 diabetes, and we'll need to start mealtime insulin.

## 2014-10-30 NOTE — Patient Instructions (Signed)
Please increase Lantus to 35 and then 40 units at bedtime. Continue Metformin 1000 mg 2x a day with meals. Start Glipizide 10 mg 2x a day, before meals.  Please stop at the lab.  Please return in 1 month with your sugar log.   PATIENT INSTRUCTIONS FOR TYPE 2 DIABETES:  **Please join MyChart!** - see attached instructions about how to join if you have not done so already.  DIET AND EXERCISE Diet and exercise is an important part of diabetic treatment.  We recommended aerobic exercise in the form of brisk walking (working between 40-60% of maximal aerobic capacity, similar to brisk walking) for 150 minutes per week (such as 30 minutes five days per week) along with 3 times per week performing 'resistance' training (using various gauge rubber tubes with handles) 5-10 exercises involving the major muscle groups (upper body, lower body and core) performing 10-15 repetitions (or near fatigue) each exercise. Start at half the above goal but build slowly to reach the above goals. If limited by weight, joint pain, or disability, we recommend daily walking in a swimming pool with water up to waist to reduce pressure from joints while allow for adequate exercise.    BLOOD GLUCOSES Monitoring your blood glucoses is important for continued management of your diabetes. Please check your blood glucoses 2-4 times a day: fasting, before meals and at bedtime (you can rotate these measurements - e.g. one day check before the 3 meals, the next day check before 2 of the meals and before bedtime, etc.).   HYPOGLYCEMIA (low blood sugar) Hypoglycemia is usually a reaction to not eating, exercising, or taking too much insulin/ other diabetes drugs.  Symptoms include tremors, sweating, hunger, confusion, headache, etc. Treat IMMEDIATELY with 15 grams of Carbs: . 4 glucose tablets .  cup regular juice/soda . 2 tablespoons raisins . 4 teaspoons sugar . 1 tablespoon honey Recheck blood glucose in 15 mins and repeat  above if still symptomatic/blood glucose <100.  RECOMMENDATIONS TO REDUCE YOUR RISK OF DIABETIC COMPLICATIONS: * Take your prescribed MEDICATION(S) * Follow a DIABETIC diet: Complex carbs, fiber rich foods, (monounsaturated and polyunsaturated) fats * AVOID saturated/trans fats, high fat foods, >2,300 mg salt per day. * EXERCISE at least 5 times a week for 30 minutes or preferably daily.  * DO NOT SMOKE OR DRINK more than 1 drink a day. * Check your FEET every day. Do not wear tightfitting shoes. Contact us if you develop an ulcer * See your EYE doctor once a year or more if needed * Get a FLU shot once a year * Get a PNEUMONIA vaccine once before and once after age 39 years  GOALS:  * Your Hemoglobin A1c of <7%  * fasting sugars need to be <130 * after meals sugars need to be <180 (2h after you start eating) * Your Systolic BP should be 778 or lower  * Your Diastolic BP should be 80 or lower  * Your HDL (Good Cholesterol) should be 40 or higher  * Your LDL (Bad Cholesterol) should be 100 or lower. * Your Triglycerides should be 150 or lower  * Your Urine microalbumin (kidney function) should be <30 * Your Body Mass Index should be 25 or lower    Please consider the following ways to cut down carbs and fat and increase fiber and micronutrients in your diet: - substitute whole grain for white bread or pasta - substitute brown rice for white rice - substitute 90-calorie flat bread pieces for slices  of bread when possible - substitute sweet potatoes or yams for white potatoes - substitute humus for margarine - substitute tofu for cheese when possible - substitute almond or rice milk for regular milk (would not drink soy milk daily due to concern for soy estrogen influence on breast cancer risk) - substitute dark chocolate for other sweets when possible - substitute water - can add lemon or orange slices for taste - for diet sodas (artificial sweeteners will trick your body that you  can eat sweets without getting calories and will lead you to overeating and weight gain in the long run) - do not skip breakfast or other meals (this will slow down the metabolism and will result in more weight gain over time)  - can try smoothies made from fruit and almond/rice milk in am instead of regular breakfast - can also try old-fashioned (not instant) oatmeal made with almond/rice milk in am - order the dressing on the side when eating salad at a restaurant (pour less than half of the dressing on the salad) - eat as little meat as possible - can try juicing, but should not forget that juicing will get rid of the fiber, so would alternate with eating raw veg./fruits or drinking smoothies - use as little oil as possible, even when using olive oil - can dress a salad with a mix of balsamic vinegar and lemon juice, for e.g. - use agave nectar, stevia sugar, or regular sugar rather than artificial sweateners - steam or broil/roast veggies  - snack on veggies/fruit/nuts (unsalted, preferably) when possible, rather than processed foods - reduce or eliminate aspartame in diet (it is in diet sodas, chewing gum, etc) Read the labels!  Try to read Dr. Janene Harvey book: "Program for Reversing Diabetes" for other ideas for healthy eating.

## 2014-11-02 LAB — GLUTAMIC ACID DECARBOXYLASE AUTO ABS: Glutamic Acid Decarb Ab: 3.8 U/mL — ABNORMAL HIGH (ref <=1.0)

## 2014-11-06 LAB — ANTI-ISLET CELL ANTIBODY: Pancreatic Islet Cell Antibody: <5 JDF Units (ref <5)

## 2014-11-10 DIAGNOSIS — E1110 Type 2 diabetes mellitus with ketoacidosis without coma: Secondary | ICD-10-CM | POA: Insufficient documentation

## 2014-12-04 ENCOUNTER — Ambulatory Visit: Payer: BLUE CROSS/BLUE SHIELD | Admitting: Internal Medicine

## 2014-12-23 ENCOUNTER — Encounter: Payer: Self-pay | Admitting: Internal Medicine

## 2014-12-23 ENCOUNTER — Ambulatory Visit (INDEPENDENT_AMBULATORY_CARE_PROVIDER_SITE_OTHER): Payer: BLUE CROSS/BLUE SHIELD | Admitting: Internal Medicine

## 2014-12-23 VITALS — BP 104/60 | HR 95 | Temp 98.1°F | Resp 12 | Wt 183.8 lb

## 2014-12-23 DIAGNOSIS — E1165 Type 2 diabetes mellitus with hyperglycemia: Secondary | ICD-10-CM

## 2014-12-23 DIAGNOSIS — B3731 Acute candidiasis of vulva and vagina: Secondary | ICD-10-CM

## 2014-12-23 DIAGNOSIS — B373 Candidiasis of vulva and vagina: Secondary | ICD-10-CM | POA: Diagnosis not present

## 2014-12-23 MED ORDER — GLIPIZIDE 10 MG PO TABS: 10.0000 mg | ORAL_TABLET | Freq: Two times a day (BID) | ORAL | Status: DC

## 2014-12-23 MED ORDER — METFORMIN HCL 1000 MG PO TABS: 1000.0000 mg | ORAL_TABLET | Freq: Two times a day (BID) | ORAL | Status: DC

## 2014-12-23 MED ORDER — LANTUS SOLOSTAR 100 UNIT/ML ~~LOC~~ SOPN: 40.0000 [IU] | PEN_INJECTOR | Freq: Every day | SUBCUTANEOUS | Status: DC

## 2014-12-23 MED ORDER — FLUCONAZOLE 150 MG PO TABS: 150.0000 mg | ORAL_TABLET | Freq: Once | ORAL | Status: DC

## 2014-12-23 NOTE — Patient Instructions (Signed)
Please continue: - Lantus to 40 units at bedtime. - Metformin 1000 mg 2x a day with meals. - Glipizide 10 mg 2x a day, before meals.  Please return in 2 months with your sugar log.  Please call and schedule an eye appt with Dr. Prudencio Burly: [facility]:  Dr. Sherlyn Lick MD ?  Address: [address], [place], [place] [address]   [phone]

## 2014-12-23 NOTE — Progress Notes (Signed)
Patient ID: Darlene Watts, female   DOB: 02/26/1991, 24 y.o.   MRN: [id number]  HPI: Darlene Watts is a 24 y.o.-year-old female, initiallyreferred by her PCP, Dr. Osker Mason [facility]), for management of DM - unclear if 1 or 2, dx in 2011, insulin-dependent, uncontrolled, with complications (DKA in 4268). She was seeing endocrinology in [place] (Dr. Christena Deem). Last visit 2 mo ago.  Last hemoglobin A1c was: Lab Results  Component Value Date   HGBA1C 13.8* 10/30/2014   Pt is on a regimen of: - Metformin 1000 mg 2x a day, with meals - Glipizide 10 mg bid - started 10/2014 - Lantus 40 units at bedtime - started 10/15/2014 She was on NovoLog 70/30 before - could not afford it and had to stop - since then changed insurance  Pt is not checking sugars since last visit as she lost her meter. Reviewed sugars from last visit. She sleeps better and for her this is a sign of better sugars.  - am: 450s before insulin; 200-350 - 2h after b'fast: n/c - before lunch: n/c - 2h after lunch: n/c - before dinner: n/c - at dinner: 200-250 - bedtime: n/c - nighttime: n/c No lows. Lowest sugar was 200; she has hypoglycemia awareness at <120.  Highest sugar was rarely HI.  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   - no CKD, last BUN/creatinine:  Lab Results  Component Value Date   BUN 13 09/14/2014   CREATININE 0.83 09/14/2014   - last set of lipids: Lab Results  Component Value Date   CHOL 150 10/30/2014   HDL 35.50* 10/30/2014   LDLCALC 91 10/30/2014   TRIG 116.0 10/30/2014   CHOLHDL 4 10/30/2014   - last eye exam was in 2014. No DR.  - no numbness and tingling in her feet.  ROS: Constitutional: no weight loss, no fatigue, no  subjective hyperthermia/hypothermia, + improved urination and nocturia Eyes: no blurry vision, no xerophthalmia ENT: no sore throat, no nodules palpated in throat, no dysphagia/odynophagia, no hoarseness Cardiovascular: no CP/SOB/palpitations/leg swelling Respiratory: no cough/SOB Gastrointestinal: no N/V/D/acid reflux, no C Musculoskeletal: no muscle/joint aches Skin: no rashes, no itching Neurological: no tremors/numbness/tingling/dizziness  I reviewed pt's medications, allergies, PMH, social hx, family hx, and changes were documented in the history of present illness. Otherwise, unchanged from my initial visit note.  Past Medical History  Diagnosis Date  . PCOS (polycystic ovarian syndrome)   . Urinary tract infection   . Trichimoniasis   . Diabetes mellitus     Type 2 - Novolog 70/30  . Herpes simplex type 2 infection March 2013  . Ketoacidosis    Past Surgical History  Procedure Laterality Date  . No past surgeries     History   Social History  . Marital Status: Single    Spouse Name: N/A  . Number of Children: 0   Occupational History  .    Social History Main Topics  . Smoking status: Never Smoker   . Smokeless tobacco: Never Used  . Alcohol Use: No  . Drug Use: No  . Sexual Activity: Yes    Birth Control/ Protection: None   Current Outpatient Prescriptions on File Prior to Visit  Medication Sig Dispense Refill  . glipiZIDE (GLUCOTROL) 10 MG tablet Take 1 tablet (10 mg total) by mouth 2 (two) times daily before a meal. 60 tablet 3  . LANTUS SOLOSTAR 100 UNIT/ML Solostar Pen Inject 40 Units into the skin daily at 10 pm.  1  . metFORMIN (GLUCOPHAGE) 1000 MG tablet Take 1,000 mg by mouth 2 (two) times daily with a meal.    . miconazole (MICOTIN) 2 % powder Apply 1 application topically 2 (two) times daily.     . Ascorbic Acid (VITAMIN C PO) Take 3-4 tablets by mouth daily as needed (For cold symptoms.).    Marland Kitchen ciprofloxacin (CIPRO) 500 MG tablet Take 1 tablet (500  mg total) by mouth 2 (two) times daily. (Patient not taking: Reported on 10/30/2014) 20 tablet 0  . loratadine (CLARITIN) 10 MG tablet Take 10 mg by mouth daily as needed for allergies.     Marland Kitchen ondansetron (ZOFRAN) 4 MG tablet Take 1 tablet (4 mg total) by mouth every 6 (six) hours. (Patient not taking: Reported on 12/23/2014) 12 tablet 0  . phenazopyridine (PYRIDIUM) 200 MG tablet Take 1 tablet (200 mg total) by mouth 3 (three) times daily. (Patient not taking: Reported on 12/23/2014) 6 tablet 0   No current facility-administered medications on file prior to visit.   Allergies  Allergen Reactions  . Amoxicillin Hives   No family history on file.  PE: BP 104/60 mmHg  Pulse 95  Temp(Src) 98.1 F (36.7 C) (Oral)  Resp 12  Wt 183 lb 12.8 oz (83.371 kg)  SpO2 98% Body mass index is 33.06 kg/(m^2).  Wt Readings from Last 3 Encounters:  12/23/14 183 lb 12.8 oz (83.371 kg)  10/30/14 174 lb (78.926 kg)  01/03/13 219 lb 2 oz (99.394 kg)   Constitutional: overweight, in NAD Eyes: PERRLA, EOMI, no exophthalmos ENT: moist mucous membranes, no thyromegaly, no cervical lymphadenopathy Cardiovascular: RRR, No MRG Respiratory: CTA B Gastrointestinal: abdomen soft, NT, ND, BS+ Musculoskeletal: no deformities, strength intact in all 4 Skin: moist, warm, no rashes, + acanthosis nigricans on neck Neurological: no tremor with outstretched hands, DTR normal in all 4  ASSESSMENT: 1. DM - unclear if 24 or 2, insulin-dependent, uncontrolled, with complications (DKA)  Component     Latest Ref Rng 10/30/2014  Glutamic Acid Decarb Ab     <=1.0 U/mL 3.8 (H)  Pancreatic Islet Cell Antibody     < 5 JDF Units <5   Patient with high GAD antibodies, and normal ICA antibodies level. We need a C-peptide (when sugars <200). If this is normal or high, I will consider that she has ketone prone diabetes and continue to manage her with the current regimen. If her C-peptide is low, I will consider that she has type 1  diabetes, and we'll need to start mealtime insulin.  2. Yeast vaginitis  PLAN:  1. Patient with long-standing, uncontrolled diabetes, on oral antidiabetic regimen + basal insulin, with unknown control recently after she lost her meter. We need to continue current regimen, as I cannot make changes w/o CBG checks. She does feel better and definitely noticed a difference since last visit. - Patient with high GAD antibodies, and normal ICA antibodies level. We need a C-peptide (when sugars <200). If this is normal or high, I will consider that she has ketone prone diabetes and continue to manage her with the current regimen. If her C-peptide is low, I will consider that she has type 1 diabetes, and we'll need to start  mealtime insulin. - We discussed about options for treatment, and I suggested to:  Patient Instructions  Please continue: - Lantus to 40 units at bedtime. - Metformin 1000 mg 2x a day with meals. - Glipizide 10 mg 2x a day, before meals.  Please return in 2 months with your sugar log.  Please call and schedule an eye appt with Dr. Prudencio Burly: [facility]:  Dr. Sherlyn Lick MD ?  Address: [address], [place], [place] [address]   [phone]  - continue sugars at different times of the day - check 2-3imes a day, rotating checks - advised for yearly eye exams >> needs one! - Return to clinic in 2 mo with sugar log   2. Yeast vaginitis - sent Rx for Diflucan

## 2014-12-24 ENCOUNTER — Telehealth: Payer: Self-pay | Admitting: *Deleted

## 2014-12-24 MED ORDER — GLUCOSE BLOOD VI STRP
ORAL_STRIP | Status: DC
Start: 2014-12-24 — End: 2017-03-08

## 2014-12-24 MED ORDER — ONETOUCH DELICA LANCETS FINE MISC: Status: DC

## 2014-12-24 MED ORDER — ONETOUCH ULTRA SYSTEM W/DEVICE KIT: 1.0000 | PACK | Freq: Once | Status: DC

## 2014-12-24 NOTE — Telephone Encounter (Signed)
Sent a One Touch Ultra meter, with strips and lancets for testing 3 times daily to her pharmacy. Be advised.

## 2015-02-23 ENCOUNTER — Ambulatory Visit: Payer: BLUE CROSS/BLUE SHIELD | Admitting: Internal Medicine

## 2015-10-05 ENCOUNTER — Emergency Department (HOSPITAL_COMMUNITY)
Admission: EM | Admit: 2015-10-05 | Discharge: 2015-10-05 | Disposition: A | Discharge status: Home / Self Care | Payer: No Typology Code available for payment source | Attending: Emergency Medicine | Admitting: Emergency Medicine

## 2015-10-05 ENCOUNTER — Encounter (HOSPITAL_COMMUNITY): Payer: Self-pay | Admitting: Emergency Medicine

## 2015-10-05 DIAGNOSIS — Z87440 Personal history of urinary (tract) infections: Secondary | ICD-10-CM | POA: Diagnosis not present

## 2015-10-05 DIAGNOSIS — E119 Type 2 diabetes mellitus without complications: Secondary | ICD-10-CM | POA: Diagnosis not present

## 2015-10-05 DIAGNOSIS — Y998 Other external cause status: Secondary | ICD-10-CM | POA: Diagnosis not present

## 2015-10-05 DIAGNOSIS — Y9389 Activity, other specified: Secondary | ICD-10-CM | POA: Diagnosis not present

## 2015-10-05 DIAGNOSIS — S134XXA Sprain of ligaments of cervical spine, initial encounter: Secondary | ICD-10-CM | POA: Diagnosis not present

## 2015-10-05 DIAGNOSIS — Z8619 Personal history of other infectious and parasitic diseases: Secondary | ICD-10-CM | POA: Diagnosis not present

## 2015-10-05 DIAGNOSIS — Y92410 Unspecified street and highway as the place of occurrence of the external cause: Secondary | ICD-10-CM | POA: Diagnosis not present

## 2015-10-05 DIAGNOSIS — Z792 Long term (current) use of antibiotics: Secondary | ICD-10-CM | POA: Insufficient documentation

## 2015-10-05 DIAGNOSIS — S4991XA Unspecified injury of right shoulder and upper arm, initial encounter: Secondary | ICD-10-CM | POA: Insufficient documentation

## 2015-10-05 DIAGNOSIS — Z79899 Other long term (current) drug therapy: Secondary | ICD-10-CM | POA: Diagnosis not present

## 2015-10-05 DIAGNOSIS — Z7984 Long term (current) use of oral hypoglycemic drugs: Secondary | ICD-10-CM | POA: Diagnosis not present

## 2015-10-05 DIAGNOSIS — S199XXA Unspecified injury of neck, initial encounter: Secondary | ICD-10-CM | POA: Diagnosis present

## 2015-10-05 DIAGNOSIS — Z880 Allergy status to penicillin: Secondary | ICD-10-CM | POA: Diagnosis not present

## 2015-10-05 MED ORDER — TRAMADOL HCL 50 MG PO TABS: 50.0000 mg | ORAL_TABLET | Freq: Four times a day (QID) | ORAL | Status: DC | PRN

## 2015-10-05 MED ORDER — IBUPROFEN 600 MG PO TABS: 600.0000 mg | ORAL_TABLET | Freq: Four times a day (QID) | ORAL | Status: DC | PRN

## 2015-10-05 MED ORDER — CYCLOBENZAPRINE HCL 10 MG PO TABS: 5.0000 mg | ORAL_TABLET | Freq: Two times a day (BID) | ORAL | Status: DC | PRN

## 2015-10-05 NOTE — ED Notes (Signed)
PA at bedside.
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
## 2015-10-05 NOTE — Discharge Instructions (Signed)
Cervical Sprain °A cervical sprain is an injury in the neck in which the strong, fibrous tissues (ligaments) that connect your neck bones stretch or tear. Cervical sprains can range from mild to severe. Severe cervical sprains can cause the neck vertebrae to be unstable. This can lead to damage of the spinal cord and can result in serious nervous system problems. The amount of time it takes for a cervical sprain to get better depends on the cause and extent of the injury. Most cervical sprains heal in 1 to 3 weeks. °CAUSES  °Severe cervical sprains may be caused by:  °· Contact sport injuries (such as from football, rugby, wrestling, hockey, auto racing, gymnastics, diving, martial arts, or boxing).   °· Motor vehicle collisions.   °· Whiplash injuries. This is an injury from a sudden forward and backward whipping movement of the head and neck.  °· Falls.   °Mild cervical sprains may be caused by:  °· Being in an awkward position, such as while cradling a telephone between your ear and shoulder.   °· Sitting in a chair that does not offer proper support.   °· Working at a poorly designed computer station.   °· Looking up or down for long periods of time.   °SYMPTOMS  °· Pain, soreness, stiffness, or a burning sensation in the front, back, or sides of the neck. This discomfort may develop immediately after the injury or slowly, 24 hours or more after the injury.   °· Pain or tenderness directly in the middle of the back of the neck.   °· Shoulder or upper back pain.   °· Limited ability to move the neck.   °· Headache.   °· Dizziness.   °· Weakness, numbness, or tingling in the hands or arms.   °· Muscle spasms.   °· Difficulty swallowing or chewing.   °· Tenderness and swelling of the neck.   °DIAGNOSIS  °Most of the time your health care provider can diagnose a cervical sprain by taking your history and doing a physical exam. Your health care provider will ask about previous neck injuries and any known neck  problems, such as arthritis in the neck. X-rays may be taken to find out if there are any other problems, such as with the bones of the neck. Other tests, such as a CT scan or MRI, may also be needed.  °TREATMENT  °Treatment depends on the severity of the cervical sprain. Mild sprains can be treated with rest, keeping the neck in place (immobilization), and pain medicines. Severe cervical sprains are immediately immobilized. Further treatment is done to help with pain, muscle spasms, and other symptoms and may include: °· Medicines, such as pain relievers, numbing medicines, or muscle relaxants.   °· Physical therapy. This may involve stretching exercises, strengthening exercises, and posture training. Exercises and improved posture can help stabilize the neck, strengthen muscles, and help stop symptoms from returning.   °HOME CARE INSTRUCTIONS  °· Put ice on the injured area.   °¨ Put ice in a plastic bag.   °¨ Place a towel between your skin and the bag.   °¨ Leave the ice on for 15-20 minutes, 3-4 times a day.   °· If your injury was severe, you may have been given a cervical collar to wear. A cervical collar is a two-piece collar designed to keep your neck from moving while it heals. °¨ Do not remove the collar unless instructed by your health care provider. °¨ If you have long hair, keep it outside of the collar. °¨ Ask your health care provider before making any adjustments to your collar. Minor   Do not remove the collar unless instructed by your health care provider.    If you have long hair, keep it outside of the collar.    Ask your health care provider before making any adjustments to your collar. Minor adjustments may be required over time to improve comfort and reduce pressure on your chin or on the back of your head.    Ifyou are allowed to remove the collar for cleaning or bathing, follow your health care provider's instructions on how to do so safely.    Keep your collar clean by wiping it with mild soap and water and drying it completely. If the collar you have been given includes removable pads, remove them every 1-2 days and hand wash them with soap and water. Allow them to air dry. They should be completely  dry before you wear them in the collar.    If you are allowed to remove the collar for cleaning and bathing, wash and dry the skin of your neck. Check your skin for irritation or sores. If you see any, tell your health care provider.    Do not drive while wearing the collar.    Only take over-the-counter or prescription medicines for pain, discomfort, or fever as directed by your health care provider.    Keep all follow-up appointments as directed by your health care provider.    Keep all physical therapy appointments as directed by your health care provider.    Make any needed adjustments to your workstation to promote good posture.    Avoid positions and activities that make your symptoms worse.    Warm up and stretch before being active to help prevent problems.   SEEK MEDICAL CARE IF:    Your pain is not controlled with medicine.    You are unable to decrease your pain medicine over time as planned.    Your activity level is not improving as expected.   SEEK IMMEDIATE MEDICAL CARE IF:    You develop any bleeding.   You develop stomach upset.   You have signs of an allergic reaction to your medicine.    Your symptoms get worse.    You develop new, unexplained symptoms.    You have numbness, tingling, weakness, or paralysis in any part of your body.   MAKE SURE YOU:    Understand these instructions.   Will watch your condition.   Will get help right away if you are not doing well or get worse.     This information is not intended to replace advice given to you by your health care provider. Make sure you discuss any questions you have with your health care provider.     Document Released: 05/07/2007 Document Revised: 07/15/2013 Document Reviewed: 01/15/2013  Elsevier Interactive Patient Education 2016 [employer]  Motor Vehicle Collision  It is common to have multiple bruises and sore muscles after a motor vehicle collision (MVC). These tend to feel worse for the first 24 hours.  You may have the most stiffness and soreness over the first several hours. You may also feel worse when you wake up the first morning after your collision. After this point, you will usually begin to improve with each day. The speed of improvement often depends on the severity of the collision, the number of injuries, and the location and nature of these injuries.  HOME CARE INSTRUCTIONS   Put ice on the injured area.   Put ice in a plastic bag.   Place   a towel between your skin and the bag.   Leave the ice on for 15-20 minutes, 3-4 times a day, or as directed by your health care provider.   Drink enough fluids to keep your urine clear or pale yellow. Do not drink alcohol.   Take a warm shower or bath once or twice a day. This will increase blood flow to sore muscles.   You may return to activities as directed by your caregiver. Be careful when lifting, as this may aggravate neck or back pain.   Only take over-the-counter or prescription medicines for pain, discomfort, or fever as directed by your caregiver. Do not use aspirin. This may increase bruising and bleeding.  SEEK IMMEDIATE MEDICAL CARE IF:   You have numbness, tingling, or weakness in the arms or legs.   You develop severe headaches not relieved with medicine.   You have severe neck pain, especially tenderness in the middle of the back of your neck.   You have changes in bowel or bladder control.   There is increasing pain in any area of the body.   You have shortness of breath, light-headedness, dizziness, or fainting.   You have chest pain.   You feel sick to your stomach (nauseous), throw up (vomit), or sweat.   You have increasing abdominal discomfort.   There is blood in your urine, stool, or vomit.   You have pain in your shoulder (shoulder strap areas).   You feel your symptoms are getting worse.  MAKE SURE YOU:   Understand these instructions.   Will watch your condition.   Will get help right away if you are not doing well  or get worse.     This information is not intended to replace advice given to you by your health care provider. Make sure you discuss any questions you have with your health care provider.     Document Released: 07/10/2005 Document Revised: 07/31/2014 Document Reviewed: 12/07/2010  Elsevier Interactive Patient Education 2016 [employer]

## 2016-08-31 DIAGNOSIS — E109 Type 1 diabetes mellitus without complications: Secondary | ICD-10-CM | POA: Diagnosis not present

## 2016-09-19 DIAGNOSIS — E109 Type 1 diabetes mellitus without complications: Secondary | ICD-10-CM | POA: Diagnosis not present

## 2016-11-28 DIAGNOSIS — Z0000 Encounter for general adult medical examination without abnormal findings: Secondary | ICD-10-CM | POA: Diagnosis not present

## 2016-11-28 DIAGNOSIS — E109 Type 1 diabetes mellitus without complications: Secondary | ICD-10-CM | POA: Diagnosis not present

## 2016-11-28 DIAGNOSIS — N390 Urinary tract infection, site not specified: Secondary | ICD-10-CM | POA: Diagnosis not present

## 2016-11-28 DIAGNOSIS — Z7689 Persons encountering health services in other specified circumstances: Secondary | ICD-10-CM | POA: Diagnosis not present

## 2016-12-05 DIAGNOSIS — E109 Type 1 diabetes mellitus without complications: Secondary | ICD-10-CM | POA: Diagnosis not present

## 2016-12-05 DIAGNOSIS — Z0000 Encounter for general adult medical examination without abnormal findings: Secondary | ICD-10-CM | POA: Diagnosis not present

## 2017-01-17 DIAGNOSIS — N390 Urinary tract infection, site not specified: Secondary | ICD-10-CM | POA: Diagnosis not present

## 2017-01-17 DIAGNOSIS — E109 Type 1 diabetes mellitus without complications: Secondary | ICD-10-CM | POA: Diagnosis not present

## 2017-01-17 DIAGNOSIS — R300 Dysuria: Secondary | ICD-10-CM | POA: Diagnosis not present

## 2017-03-08 ENCOUNTER — Inpatient Hospital Stay (HOSPITAL_COMMUNITY)
Admission: EM | Admit: 2017-03-08 | Discharge: 2017-03-12 | DRG: 690 | Disposition: A | Discharge status: Home / Self Care | Payer: 59 | Attending: Internal Medicine | Admitting: Internal Medicine

## 2017-03-08 ENCOUNTER — Encounter (HOSPITAL_COMMUNITY): Payer: Self-pay

## 2017-03-08 ENCOUNTER — Emergency Department (HOSPITAL_COMMUNITY): Payer: 59

## 2017-03-08 DIAGNOSIS — N3090 Cystitis, unspecified without hematuria: Secondary | ICD-10-CM | POA: Diagnosis not present

## 2017-03-08 DIAGNOSIS — D509 Iron deficiency anemia, unspecified: Secondary | ICD-10-CM | POA: Diagnosis not present

## 2017-03-08 DIAGNOSIS — E1065 Type 1 diabetes mellitus with hyperglycemia: Secondary | ICD-10-CM | POA: Diagnosis present

## 2017-03-08 DIAGNOSIS — B373 Candidiasis of vulva and vagina: Secondary | ICD-10-CM | POA: Diagnosis present

## 2017-03-08 DIAGNOSIS — E785 Hyperlipidemia, unspecified: Secondary | ICD-10-CM | POA: Diagnosis present

## 2017-03-08 DIAGNOSIS — E876 Hypokalemia: Secondary | ICD-10-CM | POA: Diagnosis not present

## 2017-03-08 DIAGNOSIS — R739 Hyperglycemia, unspecified: Secondary | ICD-10-CM

## 2017-03-08 DIAGNOSIS — B9620 Unspecified Escherichia coli [E. coli] as the cause of diseases classified elsewhere: Secondary | ICD-10-CM | POA: Diagnosis not present

## 2017-03-08 DIAGNOSIS — Z79899 Other long term (current) drug therapy: Secondary | ICD-10-CM

## 2017-03-08 DIAGNOSIS — Z1612 Extended spectrum beta lactamase (ESBL) resistance: Secondary | ICD-10-CM | POA: Diagnosis not present

## 2017-03-08 DIAGNOSIS — N390 Urinary tract infection, site not specified: Secondary | ICD-10-CM | POA: Diagnosis not present

## 2017-03-08 DIAGNOSIS — R1013 Epigastric pain: Secondary | ICD-10-CM | POA: Diagnosis not present

## 2017-03-08 DIAGNOSIS — B9629 Other Escherichia coli [E. coli] as the cause of diseases classified elsewhere: Secondary | ICD-10-CM | POA: Diagnosis not present

## 2017-03-08 DIAGNOSIS — N10 Acute tubulo-interstitial nephritis: Secondary | ICD-10-CM | POA: Diagnosis not present

## 2017-03-08 DIAGNOSIS — E784 Other hyperlipidemia: Secondary | ICD-10-CM | POA: Diagnosis not present

## 2017-03-08 DIAGNOSIS — N12 Tubulo-interstitial nephritis, not specified as acute or chronic: Secondary | ICD-10-CM

## 2017-03-08 DIAGNOSIS — E871 Hypo-osmolality and hyponatremia: Secondary | ICD-10-CM | POA: Diagnosis not present

## 2017-03-08 DIAGNOSIS — Z880 Allergy status to penicillin: Secondary | ICD-10-CM

## 2017-03-08 DIAGNOSIS — E669 Obesity, unspecified: Secondary | ICD-10-CM | POA: Diagnosis present

## 2017-03-08 DIAGNOSIS — R109 Unspecified abdominal pain: Secondary | ICD-10-CM | POA: Diagnosis not present

## 2017-03-08 DIAGNOSIS — E118 Type 2 diabetes mellitus with unspecified complications: Secondary | ICD-10-CM | POA: Diagnosis not present

## 2017-03-08 DIAGNOSIS — Z794 Long term (current) use of insulin: Secondary | ICD-10-CM | POA: Diagnosis not present

## 2017-03-08 DIAGNOSIS — Z7984 Long term (current) use of oral hypoglycemic drugs: Secondary | ICD-10-CM | POA: Diagnosis not present

## 2017-03-08 DIAGNOSIS — E1165 Type 2 diabetes mellitus with hyperglycemia: Secondary | ICD-10-CM | POA: Diagnosis present

## 2017-03-08 DIAGNOSIS — B3731 Acute candidiasis of vulva and vagina: Secondary | ICD-10-CM

## 2017-03-08 DIAGNOSIS — Z6835 Body mass index (BMI) 35.0-35.9, adult: Secondary | ICD-10-CM | POA: Diagnosis not present

## 2017-03-08 DIAGNOSIS — R110 Nausea: Secondary | ICD-10-CM

## 2017-03-08 LAB — CBG MONITORING, ED
GLUCOSE-CAPILLARY: 260 mg/dL — AB (ref 65–99)
GLUCOSE-CAPILLARY: 231 mg/dL — AB (ref 65–99)
Glucose-Capillary: 382 mg/dL — ABNORMAL HIGH (ref 65–99)
Glucose-Capillary: 441 mg/dL — ABNORMAL HIGH (ref 65–99)

## 2017-03-08 LAB — HEPATIC FUNCTION PANEL
ALT: 12 U/L — ABNORMAL LOW (ref 14–54)
AST: 13 U/L — AB (ref 15–41)
Albumin: 3.3 g/dL — ABNORMAL LOW (ref 3.5–5.0)
Alkaline Phosphatase: 58 U/L (ref 38–126)
Bilirubin, Direct: 0.1 mg/dL (ref 0.1–0.5)
Indirect Bilirubin: 0.7 mg/dL (ref 0.3–0.9)
Total Bilirubin: 0.8 mg/dL (ref 0.3–1.2)
Total Protein: 7.3 g/dL (ref 6.5–8.1)

## 2017-03-08 LAB — URINALYSIS, ROUTINE W REFLEX MICROSCOPIC
Bilirubin Urine: NEGATIVE
Glucose, UA: >=500 mg/dL — AB
Ketones, ur: 5 mg/dL — AB
Nitrite: NEGATIVE
PH: 6 (ref 5.0–8.0)
Protein, ur: NEGATIVE mg/dL
Specific Gravity, Urine: 1.023 (ref 1.005–1.030)

## 2017-03-08 LAB — WET PREP, GENITAL
Clue Cells Wet Prep HPF POC: NONE SEEN
SPERM: NONE SEEN
Trich, Wet Prep: NONE SEEN

## 2017-03-08 LAB — CBC
HCT: 34.2 % — ABNORMAL LOW (ref 36.0–46.0)
Hemoglobin: 11.8 g/dL — ABNORMAL LOW (ref 12.0–15.0)
MCH: 26.8 pg (ref 26.0–34.0)
MCHC: 34.5 g/dL (ref 30.0–36.0)
MCV: 77.6 fL — AB (ref 78.0–100.0)
PLATELETS: 294 10*3/uL (ref 150–400)
RBC: 4.41 MIL/uL (ref 3.87–5.11)
RDW: 12.2 % (ref 11.5–15.5)
WBC: 11.1 10*3/uL — ABNORMAL HIGH (ref 4.0–10.5)

## 2017-03-08 LAB — BASIC METABOLIC PANEL
ANION GAP: 12 (ref 5–15)
BUN: <5 mg/dL — ABNORMAL LOW (ref 6–20)
CALCIUM: 8.8 mg/dL — AB (ref 8.9–10.3)
CO2: 22 mmol/L (ref 22–32)
Chloride: 97 mmol/L — ABNORMAL LOW (ref 101–111)
Creatinine, Ser: 0.89 mg/dL (ref 0.44–1.00)
GFR calc Af Amer: >60 mL/min (ref >60)
Glucose, Bld: 443 mg/dL — ABNORMAL HIGH (ref 65–99)
Potassium: 3.9 mmol/L (ref 3.5–5.1)
Sodium: 131 mmol/L — ABNORMAL LOW (ref 135–145)

## 2017-03-08 LAB — TSH: TSH: 1.273 u[IU]/mL (ref 0.350–4.500)

## 2017-03-08 LAB — PREGNANCY, URINE: PREG TEST UR: NEGATIVE

## 2017-03-08 LAB — GLUCOSE, CAPILLARY
GLUCOSE-CAPILLARY: 215 mg/dL — AB (ref 65–99)
GLUCOSE-CAPILLARY: 276 mg/dL — AB (ref 65–99)

## 2017-03-08 MED ORDER — CEFTRIAXONE SODIUM 1 G IJ SOLR
1.0000 g | Freq: Once | INTRAMUSCULAR | Status: AC
Start: 2017-03-08 — End: 2017-03-08
  Administered 2017-03-08: 1 g via INTRAVENOUS
  Filled 2017-03-08: qty 10

## 2017-03-08 MED ORDER — SODIUM CHLORIDE 0.9 % IV SOLN
INTRAVENOUS | Status: DC
  Administered 2017-03-08 – 2017-03-10 (×3): via INTRAVENOUS

## 2017-03-08 MED ORDER — FLUCONAZOLE 100 MG PO TABS
150.0000 mg | ORAL_TABLET | Freq: Once | ORAL | Status: AC
  Administered 2017-03-08: 150 mg via ORAL
  Filled 2017-03-08: qty 2

## 2017-03-08 MED ORDER — INSULIN ASPART 100 UNIT/ML ~~LOC~~ SOLN
5.0000 [IU] | Freq: Once | SUBCUTANEOUS | Status: AC
  Administered 2017-03-08: 5 [IU] via SUBCUTANEOUS
  Filled 2017-03-08: qty 1

## 2017-03-08 MED ORDER — ONDANSETRON HCL 4 MG/2ML IJ SOLN
4.0000 mg | Freq: Four times a day (QID) | INTRAMUSCULAR | Status: DC | PRN
  Administered 2017-03-08 – 2017-03-10 (×4): 4 mg via INTRAVENOUS
  Filled 2017-03-08 (×4): qty 2

## 2017-03-08 MED ORDER — TRAMADOL HCL 50 MG PO TABS
50.0000 mg | ORAL_TABLET | Freq: Four times a day (QID) | ORAL | Status: DC | PRN
  Administered 2017-03-08 – 2017-03-10 (×4): 50 mg via ORAL
  Filled 2017-03-08 (×4): qty 1

## 2017-03-08 MED ORDER — INSULIN ASPART 100 UNIT/ML ~~LOC~~ SOLN
0.0000 [IU] | Freq: Three times a day (TID) | SUBCUTANEOUS | Status: DC
  Administered 2017-03-09: 5 [IU] via SUBCUTANEOUS
  Administered 2017-03-09: 7 [IU] via SUBCUTANEOUS
  Administered 2017-03-09: 3 [IU] via SUBCUTANEOUS
  Administered 2017-03-10 (×3): 5 [IU] via SUBCUTANEOUS
  Administered 2017-03-11 (×2): 3 [IU] via SUBCUTANEOUS

## 2017-03-08 MED ORDER — POLYETHYLENE GLYCOL 3350 17 G PO PACK: 17.0000 g | PACK | Freq: Every day | ORAL | Status: DC | PRN

## 2017-03-08 MED ORDER — ONDANSETRON HCL 4 MG PO TABS: 4.0000 mg | ORAL_TABLET | Freq: Four times a day (QID) | ORAL | Status: DC | PRN

## 2017-03-08 MED ORDER — INSULIN ASPART 100 UNIT/ML ~~LOC~~ SOLN
0.0000 [IU] | Freq: Every day | SUBCUTANEOUS | Status: DC
  Administered 2017-03-08: 3 [IU] via SUBCUTANEOUS
  Administered 2017-03-09: 2 [IU] via SUBCUTANEOUS
  Administered 2017-03-10: 3 [IU] via SUBCUTANEOUS

## 2017-03-08 MED ORDER — ACETAMINOPHEN 325 MG PO TABS
650.0000 mg | ORAL_TABLET | Freq: Four times a day (QID) | ORAL | Status: DC | PRN
  Administered 2017-03-08: 650 mg via ORAL
  Filled 2017-03-08: qty 2

## 2017-03-08 MED ORDER — SODIUM CHLORIDE 0.9 % IV BOLUS (SEPSIS)
1000.0000 mL | Freq: Once | INTRAVENOUS | Status: AC
  Administered 2017-03-08: 1000 mL via INTRAVENOUS

## 2017-03-08 MED ORDER — ACETAMINOPHEN 650 MG RE SUPP: 650.0000 mg | Freq: Four times a day (QID) | RECTAL | Status: DC | PRN

## 2017-03-08 MED ORDER — DEXTROSE 5 % IV SOLN
1.0000 g | INTRAVENOUS | Status: DC
  Administered 2017-03-09 – 2017-03-11 (×3): 1 g via INTRAVENOUS
  Filled 2017-03-08 (×4): qty 10

## 2017-03-08 MED ORDER — ENOXAPARIN SODIUM 40 MG/0.4ML ~~LOC~~ SOLN
40.0000 mg | SUBCUTANEOUS | Status: DC
  Administered 2017-03-08 – 2017-03-11 (×4): 40 mg via SUBCUTANEOUS
  Filled 2017-03-08 (×4): qty 0.4

## 2017-03-08 NOTE — ED Notes (Signed)
Attempted report x1. 

## 2017-03-08 NOTE — ED Notes (Signed)
Pt wanted her CBG checked 

## 2017-03-08 NOTE — H&P (Signed)
History and Physical    [name] HYW:[id number] DOB: 19-Nov-1990 DOA: 03/08/2017  PCP: Patient, No Pcp Per   Patient coming from: Home  Chief Complaint: Abdominal/Flank Pain, Concern for DKA  HPI: Darlene Watts is a 26 y.o. female with medical history significant of Insulin Dependent Diabetes Mellitus, Hx of Recurrent UTI's, Hx of Trichomoniasis and Hx of Herpes Simplex Type 2, Hx of DKA who presented to [facility] ED with concerns that she was going into DKA. Patient states That since Saturday she started feeling bad and lay in bed and lost her appetite. States she developed sharp abdominal Pain that radiated into her Left Flank associated with Nausea. Patient states she may have had subjective fevers but was having chills at night. Patient admits to burning and dysuria, increased urinary frequency, and generalized weakness. States she recently stopped a medication she would take for recurrent UTI's but can't remember the name of it. Of note patient has not been checking her Blood Sugars as her Glucometer has been broken. Denied any CP, Lightheadedness or dizziness but states she "gags" whenever she starts to ambulate. Has had very poor appetite. [facility] was called to admit for early DKA and concern of Pyelonephritis.    ED Course: In the ED patient had basic blood work done (mildly elevated WBC), pelvic exam, CT Renal Stone Study showed Perinephric stranding around the left kidney. Mild fullness of the left renal collecting system without visible stones. There is mild bladder wall thickening. Findings may reflect cystitis and pyelonephritis.   Review of Systems: As per HPI otherwise 10 point review of systems negative. Denied any CP   Past Medical History:  Diagnosis Date  . Diabetes mellitus    Type 2 - Novolog 70/30  . Herpes simplex type 2 infection March 2013  . Ketoacidosis   . PCOS (polycystic ovarian syndrome)   . Trichimoniasis   . Urinary tract infection    Past Surgical History:    Procedure Laterality Date  . NO PAST SURGERIES     SOCIAL HISTORY  reports that she has never smoked. She has never used smokeless tobacco. She reports that she does not drink alcohol or use drugs.  Allergies  Allergen Reactions  . Amoxicillin Hives    Has patient had a PCN reaction causing immediate rash, facial/tongue/throat swelling, SOB or lightheadedness with hypotension: Yes Has patient had a PCN reaction causing severe rash involving mucus membranes or skin necrosis: No Has patient had a PCN reaction that required hospitalization: No Has patient had a PCN reaction occurring within the last 10 years: Yes If all of the above answers are "NO", then may proceed with Cephalosporin use.    FAMILY HISTORY Mother is still living and Has Diabetes. Father Deceased and had Heart Troubles and Diabetes. Father died at the age of 56 and had a CABG.   Prior to Admission medications   Medication Sig Start Date End Date Taking? Authorizing Provider  Exenatide ER (BYDUREON BCISE) 2 MG/0.85ML AUIJ Inject 2 mg into the skin every Sunday.   Yes [provider]  glipiZIDE (GLUCOTROL) 10 MG tablet Take 1 tablet (10 mg total) by mouth 2 (two) times daily before a meal. 12/23/14  Yes Philemon Kingdom, MD  insulin detemir (LEVEMIR) 100 UNIT/ML injection Inject 60 Units into the skin at bedtime.   Yes [provider]  cyclobenzaprine (FLEXERIL) 10 MG tablet Take 0.5-1 tablets (5-10 mg total) by mouth 2 (two) times daily as needed for muscle spasms. Patient not taking:  Reported on 03/08/2017 10/05/15   Delos Haring, PA-C  ibuprofen (ADVIL,MOTRIN) 600 MG tablet Take 1 tablet (600 mg total) by mouth every 6 (six) hours as needed. Patient not taking: Reported on 03/08/2017 10/05/15   Delos Haring, PA-C  LANTUS SOLOSTAR 100 UNIT/ML Solostar Pen Inject 40 Units into the skin daily at 10 pm. Patient not taking: Reported on 03/08/2017 12/23/14   Philemon Kingdom, MD  loratadine (CLARITIN) 10 MG  tablet Take 10 mg by mouth daily as needed for allergies.     [provider]  metFORMIN (GLUCOPHAGE) 1000 MG tablet Take 1 tablet (1,000 mg total) by mouth 2 (two) times daily with a meal. Patient not taking: Reported on 03/08/2017 12/23/14   Philemon Kingdom, MD  ondansetron (ZOFRAN) 4 MG tablet Take 1 tablet (4 mg total) by mouth every 6 (six) hours. Patient not taking: Reported on 12/23/2014 09/14/14   Pixie Casino, MD  phenazopyridine (PYRIDIUM) 200 MG tablet Take 1 tablet (200 mg total) by mouth 3 (three) times daily. Patient not taking: Reported on 12/23/2014 09/14/14   Pixie Casino, MD  traMADol (ULTRAM) 50 MG tablet Take 1 tablet (50 mg total) by mouth every 6 (six) hours as needed. Patient not taking: Reported on 03/08/2017 10/05/15   Delos Haring, PA-C   Physical Exam: Vitals:   03/08/17 1515 03/08/17 1630 03/08/17 1631 03/08/17 1715  BP: 113/66 (!) 132/91 (!) 132/91 (!) 131/91  Pulse: 89 99 98 92  Resp:   17   Temp:      TempSrc:      SpO2:   100%    Constitutional: WN/WD obese Female in NAD and appears calm  Eyes: Lids and conjunctivae normal, sclerae anicteric  ENMT: External Ears, Nose appear normal. Grossly normal hearing. Mucous membranes are dry.   Neck: Appears normal, supple, no cervical masses, normal ROM, no appreciable thyromegaly, no JVD Respiratory: Clear to auscultation bilaterally, no wheezing, rales, rhonchi or crackles. Normal respiratory effort and patient is not tachypenic. No accessory muscle use.  Cardiovascular: RRR, no murmurs / rubs / gallops. S1 and S2 auscultated. No extremity edema. 2+ pedal pulses. Abdomen: Soft, Tender to palpate LUQ and Lower Quadrant over bladder, non-distended. No masses palpated. No appreciable hepatosplenomegaly. Bowel sounds positive.  GU: Deferred. Musculoskeletal: No clubbing / cyanosis of digits/nails. No joint deformity upper and lower extremities. Good ROM, no contractures. .  Skin: No rashes, lesions, ulcers on  limited skin eval. No induration; Warm and dry. Has tattoos Neurologic: CN 2-12 grossly intact with no focal deficits. Sensation intact in all 4 Extremities. Romberg sign cerebellar reflexes not assessed.  Psychiatric: Normal judgment and insight. Alert and oriented x 3. Normal mood and appropriate affect.   Labs on Admission: I have personally reviewed following labs and imaging studies  CBC:  Recent Labs Lab 03/08/17 0830  WBC 11.1*  HGB 11.8*  HCT 34.2*  MCV 77.6*  PLT 086   Basic Metabolic Panel:  Recent Labs Lab 03/08/17 0830  NA 131*  K 3.9  CL 97*  CO2 22  GLUCOSE 443*  BUN <5*  CREATININE 0.89  CALCIUM 8.8*   GFR: CrCl cannot be calculated (Unknown ideal weight.). Liver Function Tests:  Recent Labs Lab 03/08/17 1131  AST 13*  ALT 12*  ALKPHOS 58  BILITOT 0.8  PROT 7.3  ALBUMIN 3.3*   No results for input(s): LIPASE, AMYLASE in the last 168 hours. No results for input(s): AMMONIA in the last 168 hours. Coagulation Profile: No results  for input(s): INR, PROTIME in the last 168 hours. Cardiac Enzymes: No results for input(s): CKTOTAL, CKMB, CKMBINDEX, TROPONINI in the last 168 hours. BNP (last 3 results) No results for input(s): PROBNP in the last 8760 hours. HbA1C: No results for input(s): HGBA1C in the last 72 hours. CBG:  Recent Labs Lab 03/08/17 0823 03/08/17 1025 03/08/17 1357 03/08/17 1638  GLUCAP 382* 441* 260* 231*   Lipid Profile: No results for input(s): CHOL, HDL, LDLCALC, TRIG, CHOLHDL, LDLDIRECT in the last 72 hours. Thyroid Function Tests: No results for input(s): TSH, T4TOTAL, FREET4, T3FREE, THYROIDAB in the last 72 hours. Anemia Panel: No results for input(s): VITAMINB12, FOLATE, FERRITIN, TIBC, IRON, RETICCTPCT in the last 72 hours. Urine analysis:    Component Value Date/Time   COLORURINE YELLOW 03/08/2017 1009   APPEARANCEUR CLOUDY (A) 03/08/2017 1009   LABSPEC 1.023 03/08/2017 1009   PHURINE 6.0 03/08/2017 1009    GLUCOSEU >=500 (A) 03/08/2017 1009   HGBUR MODERATE (A) 03/08/2017 1009   BILIRUBINUR NEGATIVE 03/08/2017 1009   KETONESUR 5 (A) 03/08/2017 1009   PROTEINUR NEGATIVE 03/08/2017 1009   UROBILINOGEN 0.2 09/14/2014 1205   NITRITE NEGATIVE 03/08/2017 1009   LEUKOCYTESUR LARGE (A) 03/08/2017 1009   Sepsis Labs: !!!!!!!!!!!!!!!!!!!!!!!!!!!!!!!!!!!!!!!!!!!! @LABRCNTIP (procalcitonin:4,lacticidven:4) ) Recent Results (from the past 240 hour(s))  Wet prep, genital     Status: Abnormal   Collection Time: 03/08/17  3:16 PM  Result Value Ref Range Status   Yeast Wet Prep HPF POC PRESENT (A) NONE SEEN Final   Trich, Wet Prep NONE SEEN NONE SEEN Final   Clue Cells Wet Prep HPF POC NONE SEEN NONE SEEN Final   WBC, Wet Prep HPF POC MANY (A) NONE SEEN Final   Sperm NONE SEEN  Final    Radiological Exams on Admission: Ct Renal Stone Study  Result Date: 03/08/2017 CLINICAL DATA:  Left flank pain EXAM: CT ABDOMEN AND PELVIS WITHOUT CONTRAST TECHNIQUE: Multidetector CT imaging of the abdomen and pelvis was performed following the standard protocol without IV contrast. COMPARISON:  None. FINDINGS: Lower chest: Lung bases are clear. No effusions. Heart is normal size. Hepatobiliary: No focal hepatic abnormality. Gallbladder unremarkable. Pancreas: No focal abnormality or ductal dilatation. Spleen: No focal abnormality.  Normal size. Adrenals/Urinary Tract: Mild fullness of the left renal collecting system and perinephric stranding. No visible ureteral stones no stones or hydronephrosis on the right. Adrenal glands are unremarkable. Mild bladder wall thickening. Stomach/Bowel: Appendix is normal. Stomach, large and small bowel grossly unremarkable. Vascular/Lymphatic: No evidence of aneurysm or adenopathy. Reproductive: Uterus and adnexa unremarkable.  No mass. Other: No free fluid or free air. Musculoskeletal: No acute bony abnormality. IMPRESSION: Perinephric stranding around the left kidney. Mild fullness of the  left renal collecting system without visible stones. There is mild bladder wall thickening. Findings may reflect cystitis and pyelonephritis. Recommend correlation with urinalysis. Electronically Signed   By: Rolm Baptise M.D.   On: 03/08/2017 12:28    EKG: No EKG obtained by EDP  Assessment/Plan Active Problems:   Type 2 diabetes mellitus with hyperglycemia (HCC)   Pyelonephritis   Nausea   Hyponatremia   Microcytic anemia   Yeast vaginitis  Acute UTI from Pyelonephritis and Cystitis -Admit to Inpatient Med Surge -Patient complaining of Left Flank Pain and Pain over palpation of bladder -U/A showed Cloudy Urine, Many Bacteria, Moderate Hb, Large Leukocytes, Negative Nitrites, TNTC WBC's -Urine Cx Pending -CT Renal Stone Study showed Perinephric stranding around the left kidney. Mild fullness of the left renal collecting system  without visible stones. There is mild bladder wall thickening. Findings may reflect cystitis and pyelonephritis.  -WBC was 11.1 -EDP did not obtain Blood Cx and patient was placed on Abx with Ceftriaxone -C/w IV Ceftriaxone with Pharmacy to Dose -C/w IVF Rehydration at 100 mL/hr -Patient put on Clears because of Nausea -GC/Clamidya Probe sent by EDP -Check HIV   Nausea likely 2/2 to UTI -Clear Liquid Diet and Advance as tolerated -C/w IVF Rehydration as above -Antiemetic with Zofran 4 mg po/IV q6hprn  Insulin Dependent Diabetes Mellitus (Type 1.5) -Was Hyperglycemic on Admission -Hold Home Glipizide and Home Exenatide ER -C/w Levemir 60 mg sq qHS -Added Sensitive Novolog SSI AC -Continue to Monitor CBG's -Check HbA1c  Yeast on Wet Prep -Given po Fluconazole 150 mg Once  Hyponatremia -Patient's Na+ was 131 -C/w IVF Rehydration as above -Repeat CMP in AM  Microcytic Anemia -Hb/Hct was 11.8/34.2 -Continue to Monitor for S/Sx of Bleeding -Repeat CBC in AM  DVT prophylaxis: Lovenox 40 mg sq q24h Code Status: FULL CODE Family Communication:  No Family present at bedside Disposition Plan: Home when medically stable Consults called: None Admission status: Inpatient Med-Surge  Kerney Elbe, D.O. [facility] Pager [phone]  If 7PM-7AM, please contact night-coverage [email or web address] Password [facility]  03/08/2017, 6:12 PM

## 2017-03-08 NOTE — ED Provider Notes (Signed)
  Physical Exam  BP (!) 132/91   Pulse 98   Temp 98.3 F (36.8 C) (Oral)   Resp 17   LMP 02/28/2017 (Exact Date)   SpO2 100%   Physical Exam  Chaperoned GU exam performed by myself. Scant amounts of thick, white discharge in the vaginal vault. Because of the vaginal vault was mouthing erythematous. No significant tenderness. No cervical motion tenderness. No adnexal tenderness. The uterus was not enlarged.  ED Course  Pelvic exam Date/Time: 03/08/2017 4:45 PM Performed by: Sherryle Lis, Chais Fehringer A Authorized by: Joline Maxcy A  Consent: Verbal consent obtained. Consent given by: patient Patient understanding: patient states understanding of the procedure being performed Patient consent: the patient's understanding of the procedure matches consent given Patient identity confirmed: verbally with patient Local anesthesia used: no  Anesthesia: Local anesthesia used: no  Sedation: Patient sedated: no Patient tolerance: Patient tolerated the procedure well with no immediate complications      Joanne Gavel, PA-C 03/08/17 1722    Little, Wenda Overland, MD 03/10/17 2127

## 2017-03-08 NOTE — Progress Notes (Signed)
Pharmacy Antibiotic Note  Darlene Watts is a 26 y.o. female admitted on 03/08/2017 with recurrent UTI.  Pharmacy has been consulted for ceftriaxone dosing.  Tm 99 WBC 11 recently stopped abx for prevention of UTIs- cant remember the name. Noted himes with amoxicillin but tolerated ceftriaxone dose earlier today - will continue for now and watch  Plan: Ceftriaxone 1gm q24h next dose 8/17   Weight: 196 lb 3.2 oz (89 kg)  Temp (24hrs), Avg:98.7 F (37.1 C), Min:98.3 F (36.8 C), Max:99 F (37.2 C)   Recent Labs Lab 03/08/17 0830  WBC 11.1*  CREATININE 0.89    CrCl cannot be calculated (Unknown ideal weight.).    Allergies  Allergen Reactions  . Amoxicillin Hives    Has patient had a PCN reaction causing immediate rash, facial/tongue/throat swelling, SOB or lightheadedness with hypotension: Yes Has patient had a PCN reaction causing severe rash involving mucus membranes or skin necrosis: No Has patient had a PCN reaction that required hospitalization: No Has patient had a PCN reaction occurring within the last 10 years: Yes If all of the above answers are "NO", then may proceed with Cephalosporin use.     Bonnita Nasuti Pharm.D. CPP, BCPS Clinical Pharmacist [phone] 03/08/2017 8:38 PM

## 2017-03-08 NOTE — ED Triage Notes (Signed)
Pt states she has had increased fatigue. Pt states some abdominal pain and discomfort when she urinates. Pt concerned she is going into DKA. Unable to check her sugar at home. Pt alert and oriented, skin warm and dry, resp e/u.

## 2017-03-08 NOTE — ED Notes (Signed)
Checked patient cbg it was 3 notided Therapist, sports of blood sugr

## 2017-03-08 NOTE — ED Provider Notes (Signed)
[facility] DEPT Provider Note   CSN: [id number] Arrival date & time: 03/08/17  9476   History   Chief Complaint Chief Complaint  Patient presents with  . Hyperglycemia    HPI Darlene Watts is a 26 y.o. female.  Patient presented with weakness and increased thirst since Saturday. Patient has a past medical history consistent for diabetes diagnosed at approximately age 41 and history of previous DKA in [place], [place]. She stated that she developed abdominal pain, nausea (worse with movement), decreased appetite, episodic confusion, and pressure in the eyes as of this morning. She was concerned as the abdominal pain initially began in the LUQ but later radiated to the back and left flank. In addition she has increased urinary frequency, dysuria, chills, generalized weakness as well as being unable to check her blood glucose given that her meter is non functioning at this time. She is particularly concerned since her previous episode of DKA began in a similar manner and resulted in a week long stay in the hospital. Patient denied symptoms of vaginal discharge on initial evaluation.      Past Medical History:  Diagnosis Date  . Diabetes mellitus    Type 2 - Novolog 70/30  . Herpes simplex type 2 infection March 2013  . Ketoacidosis   . PCOS (polycystic ovarian syndrome)   . Trichimoniasis   . Urinary tract infection     Patient Active Problem List   Diagnosis Date Noted  . Pyelonephritis 03/08/2017  . Nausea 03/08/2017  . Hyponatremia 03/08/2017  . Microcytic anemia 03/08/2017  . Yeast vaginitis 03/08/2017  . Cystitis 03/08/2017  . Type 2 diabetes mellitus with ketoacidosis without coma ([place]) 11/10/2014  . Type 2 diabetes mellitus with hyperglycemia ([place]) 10/30/2014    Past Surgical History:  Procedure Laterality Date  . NO PAST SURGERIES      OB History    No data available       Home Medications    Prior to Admission medications   Medication Sig Start Date  End Date Taking? Authorizing Provider  Exenatide ER (BYDUREON BCISE) 2 MG/0.85ML AUIJ Inject 2 mg into the skin every Sunday.   Yes [provider]  glipiZIDE (GLUCOTROL) 10 MG tablet Take 1 tablet (10 mg total) by mouth 2 (two) times daily before a meal. 12/23/14  Yes Philemon Kingdom, MD  insulin detemir (LEVEMIR) 100 UNIT/ML injection Inject 60 Units into the skin at bedtime.   Yes [provider]  cyclobenzaprine (FLEXERIL) 10 MG tablet Take 0.5-1 tablets (5-10 mg total) by mouth 2 (two) times daily as needed for muscle spasms. Patient not taking: Reported on 03/08/2017 10/05/15   Delos Haring, PA-C  ibuprofen (ADVIL,MOTRIN) 600 MG tablet Take 1 tablet (600 mg total) by mouth every 6 (six) hours as needed. Patient not taking: Reported on 03/08/2017 10/05/15   Delos Haring, PA-C  LANTUS SOLOSTAR 100 UNIT/ML Solostar Pen Inject 40 Units into the skin daily at 10 pm. Patient not taking: Reported on 03/08/2017 12/23/14   Philemon Kingdom, MD  loratadine (CLARITIN) 10 MG tablet Take 10 mg by mouth daily as needed for allergies.     [provider]  metFORMIN (GLUCOPHAGE) 1000 MG tablet Take 1 tablet (1,000 mg total) by mouth 2 (two) times daily with a meal. Patient not taking: Reported on 03/08/2017 12/23/14   Philemon Kingdom, MD  ondansetron (ZOFRAN) 4 MG tablet Take 1 tablet (4 mg total) by mouth every 6 (six) hours. Patient not taking: Reported on 12/23/2014 09/14/14  Mabe, Forbes Cellar, MD  phenazopyridine (PYRIDIUM) 200 MG tablet Take 1 tablet (200 mg total) by mouth 3 (three) times daily. Patient not taking: Reported on 12/23/2014 09/14/14   Pixie Casino, MD  traMADol (ULTRAM) 50 MG tablet Take 1 tablet (50 mg total) by mouth every 6 (six) hours as needed. Patient not taking: Reported on 03/08/2017 10/05/15   Delos Haring, PA-C    Family History History reviewed. No pertinent family history.  Social History Social History  Substance Use Topics  . Smoking status:  Never Smoker  . Smokeless tobacco: Never Used  . Alcohol use No     Allergies   Amoxicillin   Review of Systems Review of Systems  Constitutional: Positive for activity change, appetite change, chills and fatigue. Negative for diaphoresis and fever.  HENT: Negative for congestion, sinus pressure and sore throat.   Eyes: Positive for pain. Negative for visual disturbance.  Respiratory: Negative for cough, chest tightness and shortness of breath.   Cardiovascular: Negative for chest pain and palpitations.  Gastrointestinal: Positive for abdominal pain and nausea. Negative for abdominal distention, constipation, diarrhea and vomiting.  Endocrine: Positive for polyuria. Negative for polyphagia.  Genitourinary: Positive for dysuria (With cloudy urine), flank pain and frequency.  Skin: Negative for color change and wound.  Neurological: Positive for weakness, light-headedness and headaches. Negative for seizures, syncope and numbness.  Psychiatric/Behavioral: Positive for confusion. Negative for behavioral problems. The patient is not nervous/anxious.   All other systems reviewed and are negative.    Physical Exam Updated Vital Signs BP 102/63 (BP Location: Right Wrist)   Pulse 84   Temp 99.1 F (37.3 C) (Oral)   Resp 18   Ht 5\' 2"  (1.575 m)   Wt 89 kg (196 lb 3.4 oz)   LMP 02/28/2017 (Exact Date)   SpO2 100%   BMI 35.89 kg/m   Physical Exam  Constitutional: She is oriented to person, place, and time. She appears well-developed and well-nourished. No distress.  HENT:  Head: Normocephalic and atraumatic.  Mouth/Throat: No oropharyngeal exudate.  Eyes: Pupils are equal, round, and reactive to light. Conjunctivae and EOM are normal. No scleral icterus.  Neck: Normal range of motion. Neck supple. No JVD present. No tracheal deviation present.  Cardiovascular: Normal rate and regular rhythm.   No murmur heard. Pulmonary/Chest: Effort normal and breath sounds normal. No  respiratory distress. She has no wheezes.  Abdominal: Soft. Bowel sounds are normal. She exhibits no distension and no mass. There is tenderness in the left upper quadrant. There is no rebound, no guarding and no CVA tenderness.  Musculoskeletal: She exhibits no edema, tenderness or deformity.  Neurological: She is alert and oriented to person, place, and time.  Skin: Skin is warm and dry. Capillary refill takes less than 2 seconds. She is not diaphoretic.  Psychiatric: She has a normal mood and affect. Her behavior is normal. Judgment and thought content normal.  Nursing note and vitals reviewed.    ED Treatments / Results  Labs (all labs ordered are listed, but only abnormal results are displayed) Labs Reviewed  WET PREP, GENITAL - Abnormal; Notable for the following:       Result Value   Yeast Wet Prep HPF POC PRESENT (*)    WBC, Wet Prep HPF POC MANY (*)    All other components within normal limits  URINE CULTURE - Abnormal; Notable for the following:    Culture >=100,000 COLONIES/mL ESCHERICHIA COLI (*)    All other components within  normal limits  BASIC METABOLIC PANEL - Abnormal; Notable for the following:    Sodium 131 (*)    Chloride 97 (*)    Glucose, Bld 443 (*)    BUN <5 (*)    Calcium 8.8 (*)    All other components within normal limits  CBC - Abnormal; Notable for the following:    WBC 11.1 (*)    Hemoglobin 11.8 (*)    HCT 34.2 (*)    MCV 77.6 (*)    All other components within normal limits  URINALYSIS, ROUTINE W REFLEX MICROSCOPIC - Abnormal; Notable for the following:    APPearance CLOUDY (*)    Glucose, UA >=500 (*)    Hgb urine dipstick MODERATE (*)    Ketones, ur 5 (*)    Leukocytes, UA LARGE (*)    Bacteria, UA MANY (*)    Squamous Epithelial / LPF 0-5 (*)    All other components within normal limits  HEPATIC FUNCTION PANEL - Abnormal; Notable for the following:    Albumin 3.3 (*)    AST 13 (*)    ALT 12 (*)    All other components within normal  limits  COMPREHENSIVE METABOLIC PANEL - Abnormal; Notable for the following:    Glucose, Bld 187 (*)    BUN <5 (*)    Calcium 8.1 (*)    Total Protein 6.3 (*)    Albumin 2.7 (*)    AST 11 (*)    ALT 11 (*)    All other components within normal limits  CBC - Abnormal; Notable for the following:    Hemoglobin 10.2 (*)    HCT 30.8 (*)    MCV 77.6 (*)    MCH 25.7 (*)    All other components within normal limits  GLUCOSE, CAPILLARY - Abnormal; Notable for the following:    Glucose-Capillary 215 (*)    All other components within normal limits  GLUCOSE, CAPILLARY - Abnormal; Notable for the following:    Glucose-Capillary 276 (*)    All other components within normal limits  GLUCOSE, CAPILLARY - Abnormal; Notable for the following:    Glucose-Capillary 150 (*)    All other components within normal limits  GLUCOSE, CAPILLARY - Abnormal; Notable for the following:    Glucose-Capillary 244 (*)    All other components within normal limits  GLUCOSE, CAPILLARY - Abnormal; Notable for the following:    Glucose-Capillary 328 (*)    All other components within normal limits  GLUCOSE, CAPILLARY - Abnormal; Notable for the following:    Glucose-Capillary 284 (*)    All other components within normal limits  CBG MONITORING, ED - Abnormal; Notable for the following:    Glucose-Capillary 382 (*)    All other components within normal limits  CBG MONITORING, ED - Abnormal; Notable for the following:    Glucose-Capillary 441 (*)    All other components within normal limits  CBG MONITORING, ED - Abnormal; Notable for the following:    Glucose-Capillary 260 (*)    All other components within normal limits  CBG MONITORING, ED - Abnormal; Notable for the following:    Glucose-Capillary 231 (*)    All other components within normal limits  PREGNANCY, URINE  MAGNESIUM  PHOSPHORUS  HIV ANTIBODY (ROUTINE TESTING)  TSH  HEMOGLOBIN A1C  CBC WITH DIFFERENTIAL/PLATELET  BASIC METABOLIC PANEL    GC/CHLAMYDIA PROBE AMP ([place]) NOT AT [facility]    EKG  EKG Interpretation None  Radiology Ct Renal Stone Study  Result Date: 03/08/2017 CLINICAL DATA:  Left flank pain EXAM: CT ABDOMEN AND PELVIS WITHOUT CONTRAST TECHNIQUE: Multidetector CT imaging of the abdomen and pelvis was performed following the standard protocol without IV contrast. COMPARISON:  None. FINDINGS: Lower chest: Lung bases are clear. No effusions. Heart is normal size. Hepatobiliary: No focal hepatic abnormality. Gallbladder unremarkable. Pancreas: No focal abnormality or ductal dilatation. Spleen: No focal abnormality.  Normal size. Adrenals/Urinary Tract: Mild fullness of the left renal collecting system and perinephric stranding. No visible ureteral stones no stones or hydronephrosis on the right. Adrenal glands are unremarkable. Mild bladder wall thickening. Stomach/Bowel: Appendix is normal. Stomach, large and small bowel grossly unremarkable. Vascular/Lymphatic: No evidence of aneurysm or adenopathy. Reproductive: Uterus and adnexa unremarkable.  No mass. Other: No free fluid or free air. Musculoskeletal: No acute bony abnormality. IMPRESSION: Perinephric stranding around the left kidney. Mild fullness of the left renal collecting system without visible stones. There is mild bladder wall thickening. Findings may reflect cystitis and pyelonephritis. Recommend correlation with urinalysis. Electronically Signed   By: Rolm Baptise M.D.   On: 03/08/2017 12:28    Procedures Procedures (including critical care time)  Medications Ordered in ED Medications  insulin aspart (novoLOG) injection 0-9 Units (5 Units Subcutaneous Given 03/09/17 1644)  insulin aspart (novoLOG) injection 0-5 Units (3 Units Subcutaneous Given 03/08/17 2231)  enoxaparin (LOVENOX) injection 40 mg (40 mg Subcutaneous Given 03/08/17 2042)  0.9 %  sodium chloride infusion ( Intravenous Rate/Dose Change 03/09/17 1201)  acetaminophen (TYLENOL) tablet 650  mg (650 mg Oral Given 03/08/17 2042)    Or  acetaminophen (TYLENOL) suppository 650 mg ( Rectal See Alternative 03/08/17 2042)  traMADol (ULTRAM) tablet 50 mg (50 mg Oral Given 03/09/17 1644)  polyethylene glycol (MIRALAX / GLYCOLAX) packet 17 g (not administered)  ondansetron (ZOFRAN) tablet 4 mg ( Oral See Alternative 03/09/17 0046)    Or  ondansetron (ZOFRAN) injection 4 mg (4 mg Intravenous Given 03/09/17 0046)  cefTRIAXone (ROCEPHIN) 1 g in dextrose 5 % 50 mL IVPB (0 g Intravenous Stopped 03/09/17 1449)  insulin glargine (LANTUS) injection 10 Units (10 Units Subcutaneous Given 03/09/17 1419)  sodium chloride 0.9 % bolus 1,000 mL (0 mLs Intravenous Stopped 03/08/17 1157)  insulin aspart (novoLOG) injection 5 Units (5 Units Subcutaneous Given 03/08/17 1140)  sodium chloride 0.9 % bolus 1,000 mL (0 mLs Intravenous Stopped 03/08/17 1624)  fluconazole (DIFLUCAN) tablet 150 mg (150 mg Oral Given 03/08/17 1619)  cefTRIAXone (ROCEPHIN) 1 g in dextrose 5 % 50 mL IVPB (0 g Intravenous Stopped 03/08/17 1720)  promethazine (PHENERGAN) injection 25 mg (25 mg Intravenous Given 03/09/17 0424)  potassium chloride (KLOR-CON) packet 40 mEq (40 mEq Oral Given 03/09/17 1419)     Initial Impression / Assessment and Plan / ED Course  I have reviewed the triage vital signs and the nursing notes.  Pertinent labs & imaging results that were available during my care of the patient were reviewed by me and considered in my medical decision making (see chart for details).     Patient presented with 5 days of progressively worsening abdominal pain, nausea, weakness, dysuria and urinary frequency. She states that this episode resembled her previous episode of DKA but that she has abdominal pain and dysuria today. Given her presentation, PMH and current evaluation, she is most likely suffering from a UTI induced hyperglycemia. However, a differential includes diverticulitis, pyelonephritis, renal calculi with secondary infection  or pancreatitis.   10:40 AM, CBC, BMP,  UA, pregnancy test, hepatic function panel, CBG ordered.  11:03 AM, CBC demonstrated leukocytosis of 11.1, Hgb 11.8 BMP demonstrated BUN <5 and glucose 443 absent evidence of anion gap. UA was consistent with UTI, will treat with  Pregnancy test negative  1:57 PM, CBG demonstrated 260 after 5 units of Novolog subcutaneous.   3:20 PM, PA McDonald left note on pelvic exam. Please reference that note. Wet prep performed.   4:20 PM, Patient treated with Ceftriaxone for UTI and probable Pyelonephritis. Patient to be admitted to the floor for treatment of her pyelonephritis and control of her blood glucose. Given her poor medication compliance and current CBG elevation, it is most efficacious that she be treated for her pyelonephritis in the hospital where her blood glucose could also be better observed and controlled.   Final Clinical Impressions(s) / ED Diagnoses   Final diagnoses:  Hyperglycemia  Pyelonephritis   New Prescriptions Current Discharge Medication List       Kathi Ludwig, MD 03/09/17 1948    Rex Kras, Wenda Overland, MD 03/11/17 2006

## 2017-03-08 NOTE — ED Notes (Signed)
Checked patient cbg it read 72 notified RN of blood sugar

## 2017-03-09 LAB — CBC
HCT: 30.8 % — ABNORMAL LOW (ref 36.0–46.0)
Hemoglobin: 10.2 g/dL — ABNORMAL LOW (ref 12.0–15.0)
MCH: 25.7 pg — AB (ref 26.0–34.0)
MCHC: 33.1 g/dL (ref 30.0–36.0)
MCV: 77.6 fL — AB (ref 78.0–100.0)
PLATELETS: 259 10*3/uL (ref 150–400)
RBC: 3.97 MIL/uL (ref 3.87–5.11)
RDW: 12.3 % (ref 11.5–15.5)
WBC: 9.6 10*3/uL (ref 4.0–10.5)

## 2017-03-09 LAB — COMPREHENSIVE METABOLIC PANEL WITH GFR
ALT: 11 U/L — ABNORMAL LOW (ref 14–54)
AST: 11 U/L — ABNORMAL LOW (ref 15–41)
Albumin: 2.7 g/dL — ABNORMAL LOW (ref 3.5–5.0)
Alkaline Phosphatase: 49 U/L (ref 38–126)
Anion gap: 7 (ref 5–15)
BUN: <5 mg/dL — ABNORMAL LOW (ref 6–20)
CO2: 25 mmol/L (ref 22–32)
Calcium: 8.1 mg/dL — ABNORMAL LOW (ref 8.9–10.3)
Chloride: 103 mmol/L (ref 101–111)
Creatinine, Ser: 0.7 mg/dL (ref 0.44–1.00)
GFR calc Af Amer: >60 mL/min (ref >60)
GFR calc non Af Amer: >60 mL/min (ref >60)
Glucose, Bld: 187 mg/dL — ABNORMAL HIGH (ref 65–99)
Potassium: 3.5 mmol/L (ref 3.5–5.1)
Sodium: 135 mmol/L (ref 135–145)
Total Bilirubin: 0.7 mg/dL (ref 0.3–1.2)
Total Protein: 6.3 g/dL — ABNORMAL LOW (ref 6.5–8.1)

## 2017-03-09 LAB — GC/CHLAMYDIA PROBE AMP (~~LOC~~) NOT AT ARMC
Chlamydia: NEGATIVE
Neisseria Gonorrhea: NEGATIVE

## 2017-03-09 LAB — GLUCOSE, CAPILLARY
GLUCOSE-CAPILLARY: 328 mg/dL — AB (ref 65–99)
GLUCOSE-CAPILLARY: 284 mg/dL — AB (ref 65–99)
Glucose-Capillary: 150 mg/dL — ABNORMAL HIGH (ref 65–99)
Glucose-Capillary: 244 mg/dL — ABNORMAL HIGH (ref 65–99)
Glucose-Capillary: 248 mg/dL — ABNORMAL HIGH (ref 65–99)

## 2017-03-09 LAB — HIV ANTIBODY (ROUTINE TESTING W REFLEX): HIV Screen 4th Generation wRfx: NONREACTIVE

## 2017-03-09 LAB — MAGNESIUM: Magnesium: 1.8 mg/dL (ref 1.7–2.4)

## 2017-03-09 LAB — PHOSPHORUS: Phosphorus: 3 mg/dL (ref 2.5–4.6)

## 2017-03-09 MED ORDER — INSULIN GLARGINE 100 UNIT/ML ~~LOC~~ SOLN
10.0000 [IU] | Freq: Every day | SUBCUTANEOUS | Status: DC
  Administered 2017-03-09 – 2017-03-10 (×2): 10 [IU] via SUBCUTANEOUS
  Filled 2017-03-09 (×2): qty 0.1

## 2017-03-09 MED ORDER — PROMETHAZINE HCL 25 MG/ML IJ SOLN
25.0000 mg | Freq: Once | INTRAMUSCULAR | Status: AC
  Administered 2017-03-09: 25 mg via INTRAVENOUS
  Filled 2017-03-09: qty 1

## 2017-03-09 MED ORDER — POTASSIUM CHLORIDE 20 MEQ PO PACK
40.0000 meq | PACK | Freq: Once | ORAL | Status: AC
  Administered 2017-03-09: 40 meq via ORAL
  Filled 2017-03-09: qty 2

## 2017-03-09 NOTE — Progress Notes (Addendum)
PROGRESS NOTE    Darlene Watts  EYC:[id number] DOB: Apr 27, 1991 DOA: 03/08/2017 PCP: Patient, No Pcp Per    Brief Narrative:  26 year old female who presented with abdominal/flank pain. Patient is known to have type 1 diabetes mellitus. For the last 5 days prior to hospitalization patient had generalized malaise, decreased appetite, sharp abdominal pain, with radiation to the left flank, associated with nausea, subjective fevers, dysuria, and increased urine frequency. On the initial physical examination blood pressure 113/66, heart rate 89, respiratory rate 17, oxygen saturation 100%. Moist mucous membranes, lungs were clear to auscultation bilaterally, heart S1-S2 present rhythmic, no gallops, rubs or murmurs, the abdomen was soft, tender to palpation on left upper quadrant, left lower quadrant, suprapubic region. No rebound or peritoneal signs. No lower extremity edema. Sodium 131, potassium 3.9, chloride 97, bicarbonate 22, glucose of 443, BUN less than 5, creatinine 0.89, anion gap 12, white count 11.1, hemoglobin 11.8 hematocrit 34.2, platelets 294. Urine analysis with  too numerous to count white cells, 6-30 RBCs, greater than 500 glucose, specific gravity 1.023. CT of the abdomen with a perinephric stranding around left kidney, with mild bladder wall thickening.   Patient admitted to the hospital with acute pyelonephritis, complicated by hyperglycemia..   Assessment & Plan:   Active Problems:   Type 2 diabetes mellitus with hyperglycemia (HCC)   Pyelonephritis   Nausea   Hyponatremia   Microcytic anemia   Yeast vaginitis   Cystitis   1. Acute left pyelonephritis. Will continue antibiotic therapy with IV ceftriaxone, will follow on cultures, cell count and temperature curve. Old records personally reviewed, patient had positive urine culture for E Coli, sensitive to cephalosporins. Continue IV fluids, will decrease to 75 ml per hour to prevent volume overload, follow a restrictive IV  fluids strategy.   2. Uncontrolled hyperglycemia. Will continue insulin sliding scale for glucose cover and monitoring. Capillary glucose 244, 328, 284. Will resume low dose long acting insulin to prevent acute worsening.   3. Type 1 diabetes mellitus. Will advance to diabetic diet, continue insulin therapy.   4. Hypokalemia. Replete K with oral kcl, follow on renal panel in am.   5. Yeast vaginitis. SP fluconazole.  DVT prophylaxis: enoxaparin  Code Status: full  Family Communication:  Disposition Plan:    Consultants:     Procedures:    Antimicrobials:    Subjective: Patient with persistent abdominal and flank pain, improved in intensity, improved with analgesics, no worsening factors, no associated fever or chills. No nausea or vomiting, no chest pain or dyspnea.   Objective: Vitals:   03/08/17 2338 03/09/17 0500 03/09/17 0505 03/09/17 0908  BP:   119/69 (!) 100/50  Pulse:   92 73  Resp:   18 18  Temp: 98.6 F (37 C)  98.6 F (37 C) 98.3 F (36.8 C)  TempSrc: Oral  Oral Oral  SpO2:   100% 100%  Weight:  89 kg (196 lb 3.4 oz)    Height:        Intake/Output Summary (Last 24 hours) at 03/09/17 1117 Last data filed at 03/09/17 1000  Gross per 24 hour  Intake          2221.67 ml  Output              350 ml  Net          1871.67 ml   Filed Weights   03/08/17 2004 03/09/17 0500  Weight: 89 kg (196 lb 3.2 oz) 89 kg (196 lb 3.4  oz)    Examination:  General exam: deconditioned E ENT. No pallor or icterus, oral mucosa moist.  Respiratory system: Clear to auscultation. Respiratory effort normal. No wheezing, rales or rhonchi.  Cardiovascular system: S1 & S2 heard, RRR. No JVD, murmurs, rubs, gallops or clicks. No pedal edema. Gastrointestinal system: Abdomen is nondistended, soft. Tender to deep palpation on the left side. No organomegaly or masses felt. Normal bowel sounds heard. Central nervous system: Alert and oriented. No focal neurological  deficits. Extremities: Symmetric 5 x 5 power. Skin: No rashes, lesions or ulcers     Data Reviewed: I have personally reviewed following labs and imaging studies  CBC:  Recent Labs Lab 03/08/17 0830 03/09/17 0301  WBC 11.1* 9.6  HGB 11.8* 10.2*  HCT 34.2* 30.8*  MCV 77.6* 77.6*  PLT 294 510   Basic Metabolic Panel:  Recent Labs Lab 03/08/17 0830 03/09/17 0301  NA 131* 135  K 3.9 3.5  CL 97* 103  CO2 22 25  GLUCOSE 443* 187*  BUN <5* <5*  CREATININE 0.89 0.70  CALCIUM 8.8* 8.1*  MG  --  1.8  PHOS  --  3.0   GFR: Estimated Creatinine Clearance: 112.5 mL/min (by C-G formula based on SCr of 0.7 mg/dL). Liver Function Tests:  Recent Labs Lab 03/08/17 1131 03/09/17 0301  AST 13* 11*  ALT 12* 11*  ALKPHOS 58 49  BILITOT 0.8 0.7  PROT 7.3 6.3*  ALBUMIN 3.3* 2.7*   No results for input(s): LIPASE, AMYLASE in the last 168 hours. No results for input(s): AMMONIA in the last 168 hours. Coagulation Profile: No results for input(s): INR, PROTIME in the last 168 hours. Cardiac Enzymes: No results for input(s): CKTOTAL, CKMB, CKMBINDEX, TROPONINI in the last 168 hours. BNP (last 3 results) No results for input(s): PROBNP in the last 8760 hours. HbA1C: No results for input(s): HGBA1C in the last 72 hours. CBG:  Recent Labs Lab 03/08/17 1638 03/08/17 2002 03/08/17 2202 03/09/17 0344 03/09/17 0803  GLUCAP 231* 215* 276* 150* 244*   Lipid Profile: No results for input(s): CHOL, HDL, LDLCALC, TRIG, CHOLHDL, LDLDIRECT in the last 72 hours. Thyroid Function Tests:  Recent Labs  03/08/17 2014  TSH 1.273   Anemia Panel: No results for input(s): VITAMINB12, FOLATE, FERRITIN, TIBC, IRON, RETICCTPCT in the last 72 hours. Sepsis Labs: No results for input(s): PROCALCITON, LATICACIDVEN in the last 168 hours.  Recent Results (from the past 240 hour(s))  Wet prep, genital     Status: Abnormal   Collection Time: 03/08/17  3:16 PM  Result Value Ref Range  Status   Yeast Wet Prep HPF POC PRESENT (A) NONE SEEN Final   Trich, Wet Prep NONE SEEN NONE SEEN Final   Clue Cells Wet Prep HPF POC NONE SEEN NONE SEEN Final   WBC, Wet Prep HPF POC MANY (A) NONE SEEN Final   Sperm NONE SEEN  Final         Radiology Studies: Ct Renal Stone Study  Result Date: 03/08/2017 CLINICAL DATA:  Left flank pain EXAM: CT ABDOMEN AND PELVIS WITHOUT CONTRAST TECHNIQUE: Multidetector CT imaging of the abdomen and pelvis was performed following the standard protocol without IV contrast. COMPARISON:  None. FINDINGS: Lower chest: Lung bases are clear. No effusions. Heart is normal size. Hepatobiliary: No focal hepatic abnormality. Gallbladder unremarkable. Pancreas: No focal abnormality or ductal dilatation. Spleen: No focal abnormality.  Normal size. Adrenals/Urinary Tract: Mild fullness of the left renal collecting system and perinephric stranding. No visible ureteral  stones no stones or hydronephrosis on the right. Adrenal glands are unremarkable. Mild bladder wall thickening. Stomach/Bowel: Appendix is normal. Stomach, large and small bowel grossly unremarkable. Vascular/Lymphatic: No evidence of aneurysm or adenopathy. Reproductive: Uterus and adnexa unremarkable.  No mass. Other: No free fluid or free air. Musculoskeletal: No acute bony abnormality. IMPRESSION: Perinephric stranding around the left kidney. Mild fullness of the left renal collecting system without visible stones. There is mild bladder wall thickening. Findings may reflect cystitis and pyelonephritis. Recommend correlation with urinalysis. Electronically Signed   By: Rolm Baptise M.D.   On: 03/08/2017 12:28        Scheduled Meds: . enoxaparin (LOVENOX) injection  40 mg Subcutaneous Q24H  . insulin aspart  0-5 Units Subcutaneous QHS  . insulin aspart  0-9 Units Subcutaneous TID WC   Continuous Infusions: . sodium chloride 100 mL/hr at 03/09/17 0657  . cefTRIAXone (ROCEPHIN)  IV       LOS: 1 day         Tawni Millers, MD [facility] Pager [phone] If 7PM-7AM, please contact night-coverage [email or web address] Password [email or web address] 03/09/2017, 11:17 AM

## 2017-03-09 NOTE — Progress Notes (Signed)
New Admission Note:  Arrival Method: Via bed from ED. Mental Orientation: Alert & Oriented x4.  Telemetry: None Assessment: Completed Skin: Refer to flowhseet. IV: Left Hand Pain: 0/10 Tubes: None Safety Measures: Safety Fall Prevention Plan discussed with patient. Admission: Completed [facility] [place] Orientation: Patient has been orientated to the room, unit and the staff.  Orders have been reviewed and implemented. Will continue to monitor the patient. Call light has been placed within reach.   Vassie Moselle, RN  Phone Number: [phone]

## 2017-03-09 NOTE — Progress Notes (Signed)
Inpatient Diabetes Program Recommendations  AACE/ADA: New Consensus Statement on Inpatient Glycemic Control (2015)  Target Ranges:  Prepandial:   less than 140 mg/dL      Peak postprandial:   less than 180 mg/dL (1-2 hours)      Critically ill patients:  140 - 180 mg/dL   Lab Results  Component Value Date   GLUCAP 244 (H) 03/09/2017   HGBA1C 13.8 (H) 10/30/2014    Review of Glycemic Control Results for RAELEY, GILMORE (MRN [id number]) as of 03/09/2017 11:15  Ref. Range 03/08/2017 08:23 03/08/2017 10:25 03/08/2017 13:57 03/08/2017 16:38 03/08/2017 20:02 03/08/2017 22:02 03/09/2017 03:44 03/09/2017 08:03  Glucose-Capillary Latest Ref Range: 65 - 99 mg/dL 382 (H) 441 (H) 260 (H) 231 (H) 215 (H) 276 (H) 150 (H) 244 (H)   Diabetes history: DM Outpatient Diabetes medications: Levemir 60 units qd + Glucotrol 10 mg bid  Current orders for Inpatient glycemic control: Novolog correction 0-9 units tid + 0-5 units hs  Inpatient Diabetes Program Recommendations:  Spoke with patient to verify patient is taking Levemir 60 units daily prior to admission. Patient has not been checking her CBGs @ home due to meter needs a battery. Patient agrees to change battery in meter and restart checking her CBGs when discharged home. Noted patient saw Dr. Philemon Kingdom on 07/30/38 and was uncertain if patient is type 1 or type 2.   Please consider starting on Levemir 30 units daily and adjust as needed.  Thank you, Nani Gasser. Shakur Lembo, RN, MSN, CDE  Diabetes Coordinator Inpatient Glycemic Control Team Team Pager [phone] (8am-5pm) 03/09/2017 11:34 AM

## 2017-03-10 LAB — CBC WITH DIFFERENTIAL/PLATELET
BASOS ABS: 0 10*3/uL (ref 0.0–0.1)
Basophils Absolute: 0 10*3/uL (ref 0.0–0.1)
Basophils Relative: 0 %
Basophils Relative: 0 %
EOS ABS: 0 10*3/uL (ref 0.0–0.7)
EOS ABS: 0 10*3/uL (ref 0.0–0.7)
EOS PCT: 1 %
Eosinophils Relative: 0 %
HCT: 31.3 % — ABNORMAL LOW (ref 36.0–46.0)
HCT: 29.6 % — ABNORMAL LOW (ref 36.0–46.0)
Hemoglobin: 10.3 g/dL — ABNORMAL LOW (ref 12.0–15.0)
Hemoglobin: 9.8 g/dL — ABNORMAL LOW (ref 12.0–15.0)
LYMPHS ABS: 2.4 10*3/uL (ref 0.7–4.0)
LYMPHS PCT: 31 %
Lymphocytes Relative: 32 %
Lymphs Abs: 2.1 10*3/uL (ref 0.7–4.0)
MCH: 25.7 pg — AB (ref 26.0–34.0)
MCH: 25.8 pg — ABNORMAL LOW (ref 26.0–34.0)
MCHC: 32.9 g/dL (ref 30.0–36.0)
MCHC: 33.1 g/dL (ref 30.0–36.0)
MCV: 78.1 fL (ref 78.0–100.0)
MCV: 77.9 fL — AB (ref 78.0–100.0)
MONO ABS: 0.6 10*3/uL (ref 0.1–1.0)
MONOS PCT: 10 %
Monocytes Absolute: 0.7 10*3/uL (ref 0.1–1.0)
Monocytes Relative: 8 %
Neutro Abs: 4.3 10*3/uL (ref 1.7–7.7)
Neutro Abs: 4.1 10*3/uL (ref 1.7–7.7)
Neutrophils Relative %: 58 %
Neutrophils Relative %: 60 %
PLATELETS: 269 10*3/uL (ref 150–400)
Platelets: 304 10*3/uL (ref 150–400)
RBC: 4.01 MIL/uL (ref 3.87–5.11)
RBC: 3.8 MIL/uL — ABNORMAL LOW (ref 3.87–5.11)
RDW: 12.3 % (ref 11.5–15.5)
RDW: 12.4 % (ref 11.5–15.5)
WBC: 7.5 10*3/uL (ref 4.0–10.5)
WBC: 6.8 10*3/uL (ref 4.0–10.5)

## 2017-03-10 LAB — BASIC METABOLIC PANEL
Anion gap: 6 (ref 5–15)
CALCIUM: 8.4 mg/dL — AB (ref 8.9–10.3)
CO2: 27 mmol/L (ref 22–32)
CREATININE: 0.69 mg/dL (ref 0.44–1.00)
Chloride: 103 mmol/L (ref 101–111)
GFR calc Af Amer: >60 mL/min (ref >60)
Glucose, Bld: 280 mg/dL — ABNORMAL HIGH (ref 65–99)
Potassium: 4 mmol/L (ref 3.5–5.1)
SODIUM: 136 mmol/L (ref 135–145)

## 2017-03-10 LAB — URINE CULTURE: Special Requests: NORMAL

## 2017-03-10 LAB — HEMOGLOBIN A1C
HEMOGLOBIN A1C: 13.7 % — AB (ref 4.8–5.6)
Mean Plasma Glucose: 346 mg/dL

## 2017-03-10 LAB — GLUCOSE, CAPILLARY
GLUCOSE-CAPILLARY: 255 mg/dL — AB (ref 65–99)
Glucose-Capillary: 292 mg/dL — ABNORMAL HIGH (ref 65–99)
Glucose-Capillary: 256 mg/dL — ABNORMAL HIGH (ref 65–99)
Glucose-Capillary: 262 mg/dL — ABNORMAL HIGH (ref 65–99)

## 2017-03-10 MED ORDER — INSULIN DETEMIR 100 UNIT/ML ~~LOC~~ SOLN
5.0000 [IU] | Freq: Once | SUBCUTANEOUS | Status: AC
  Administered 2017-03-10: 5 [IU] via SUBCUTANEOUS
  Filled 2017-03-10: qty 0.05

## 2017-03-10 MED ORDER — FAMOTIDINE IN NACL 20-0.9 MG/50ML-% IV SOLN
20.0000 mg | Freq: Two times a day (BID) | INTRAVENOUS | Status: DC
  Administered 2017-03-10 – 2017-03-12 (×4): 20 mg via INTRAVENOUS
  Filled 2017-03-10 (×6): qty 50

## 2017-03-10 MED ORDER — PROMETHAZINE HCL 25 MG/ML IJ SOLN
12.5000 mg | Freq: Four times a day (QID) | INTRAMUSCULAR | Status: DC | PRN
  Filled 2017-03-10: qty 1

## 2017-03-10 MED ORDER — SUCRALFATE 1 G PO TABS
1.0000 g | ORAL_TABLET | Freq: Three times a day (TID) | ORAL | Status: DC
  Administered 2017-03-10 – 2017-03-12 (×7): 1 g via ORAL
  Filled 2017-03-10 (×7): qty 1

## 2017-03-10 MED ORDER — INSULIN DETEMIR 100 UNIT/ML ~~LOC~~ SOLN
15.0000 [IU] | Freq: Every day | SUBCUTANEOUS | Status: DC
  Administered 2017-03-11: 15 [IU] via SUBCUTANEOUS
  Filled 2017-03-10: qty 0.15

## 2017-03-10 NOTE — Progress Notes (Signed)
PROGRESS NOTE    Darlene Watts  QPR:[id number] DOB: [birth year] DOA: 03/08/2017 PCP: Patient, No Pcp Per    Brief Narrative:  26 year old female who presented with abdominal/flank pain. Patient is known to have type 1 diabetes mellitus. For the last 5 days prior to hospitalization patient had generalized malaise, decreased appetite, sharp abdominal pain, with radiation to the left flank, associated with nausea, subjective fevers, dysuria, and increased urine frequency. On the initial physical examination blood pressure 113/66, heart rate 89, respiratory rate 17, oxygen saturation 100%. Moist mucous membranes, lungs were clear to auscultation bilaterally, heart S1-S2 present rhythmic, no gallops, rubs or murmurs, the abdomen was soft, tender to palpation on left upper quadrant, left lower quadrant, suprapubic region. No rebound or peritoneal signs. No lower extremity edema. Sodium 131, potassium 3.9, chloride 97, bicarbonate 22, glucose of 443, BUN less than 5, creatinine 0.89, anion gap 12, white count 11.1, hemoglobin 11.8 hematocrit 34.2, platelets 294. Urine analysis with  too numerous to count white cells, 6-30 RBCs, greater than 500 glucose, specific gravity 1.023. CT of the abdomen with a perinephric stranding around left kidney, with mild bladder wall thickening.   Patient admitted to the hospital with acute pyelonephritis, complicated by hyperglycemia. Has developed recurrent nausea and vomiting.    Assessment & Plan:   Active Problems:   Type 2 diabetes mellitus with hyperglycemia (HCC)   Pyelonephritis   Nausea   Hyponatremia   Microcytic anemia   Yeast vaginitis   Cystitis   1. Acute left pyelonephritis, present on admission. Antibiotic therapy with IV ceftriaxone, urine culture positive for E coli, sensitive to cephalosporins. Will hold on IV fluids, cell count 6,8, patient has remained afebrile.   2. New dyspepsia, nausea and vomiting. Will continue antiemetics as needed with  zofran and phenergan, will add antiacid therapy with famotidine IV and po sucralfate, continue diabetic diet.   2. Uncontrolled hyperglycemia. Persistent hyperglycemia with capillary glucose 284, 248, 255, 292. Will increase long acting insulin to 15 units, and will continue insulin sliding scale, patient tolerating po.   3. Type 1 diabetes mellitus. Continue insulin therapy with combination of long acting and short acting, sliding scale, continue to hold on metformin, and glipizide.   4. Hypokalemia. Serum K corrected to 4,0, will follow renal panel in am, renal function preserved with serum cr at 0,69, with serum bicarbonate at 27.   5. Yeast vaginitis. SP fluconazole x11 dose.   DVT prophylaxis: enoxaparin  Code Status: full  Family Communication:  Disposition Plan:    Consultants:     Procedures:    Antimicrobials:     Subjective: Patient with persistent nausea, mild to moderate in intensity, improved with antiemetics, no worsening factors, this am one episode of vomiting, no abdominal pain, no diarrhea. Flank pain has improved, ild in intensity.    Objective: Vitals:   03/09/17 1645 03/09/17 2113 03/10/17 0500 03/10/17 0549  BP: 102/63 107/61  104/60  Pulse: 84 91  69  Resp: 18 16  17   Temp: 99.1 F (37.3 C) 98.9 F (37.2 C)  98.9 F (37.2 C)  TempSrc: Oral Oral  Oral  SpO2: 100% 98%  98%  Weight:  90 kg (198 lb 8 oz) 90 kg (198 lb 6.6 oz) 91.1 kg (200 lb 13.4 oz)  Height:        Intake/Output Summary (Last 24 hours) at 03/10/17 9935 Last data filed at 03/09/17 2201  Gross per 24 hour  Intake  2003.25 ml  Output                0 ml  Net          2003.25 ml   Filed Weights   03/09/17 2113 03/10/17 0500 03/10/17 0549  Weight: 90 kg (198 lb 8 oz) 90 kg (198 lb 6.6 oz) 91.1 kg (200 lb 13.4 oz)    Examination:  General exam: deconditioned E ENT: mild pallor, no icterus, oral mucosa moist.  Respiratory system: Clear to auscultation.  Respiratory effort normal. No wheezing, rales or rhonchi.  Cardiovascular system: S1 & S2 heard, RRR. No JVD, murmurs, rubs, gallops or clicks. No pedal edema. Gastrointestinal system: Abdomen is nondistended, soft and nontender. No organomegaly or masses felt. Normal bowel sounds heard. Central nervous system: Alert and oriented. No focal neurological deficits. Extremities: Symmetric 5 x 5 power. Skin: No rashes, lesions or ulcers     Data Reviewed: I have personally reviewed following labs and imaging studies  CBC:  Recent Labs Lab 03/08/17 0830 03/09/17 0301 03/10/17 0254  WBC 11.1* 9.6 7.5  NEUTROABS  --   --  4.3  HGB 11.8* 10.2* 10.3*  HCT 34.2* 30.8* 31.3*  MCV 77.6* 77.6* 78.1  PLT 294 259 710   Basic Metabolic Panel:  Recent Labs Lab 03/08/17 0830 03/09/17 0301 03/10/17 0254  NA 131* 135 136  K 3.9 3.5 4.0  CL 97* 103 103  CO2 22 25 27   GLUCOSE 443* 187* 280*  BUN <5* <5* <5*  CREATININE 0.89 0.70 0.69  CALCIUM 8.8* 8.1* 8.4*  MG  --  1.8  --   PHOS  --  3.0  --    GFR: Estimated Creatinine Clearance: 113.8 mL/min (by C-G formula based on SCr of 0.69 mg/dL). Liver Function Tests:  Recent Labs Lab 03/08/17 1131 03/09/17 0301  AST 13* 11*  ALT 12* 11*  ALKPHOS 58 49  BILITOT 0.8 0.7  PROT 7.3 6.3*  ALBUMIN 3.3* 2.7*   No results for input(s): LIPASE, AMYLASE in the last 168 hours. No results for input(s): AMMONIA in the last 168 hours. Coagulation Profile: No results for input(s): INR, PROTIME in the last 168 hours. Cardiac Enzymes: No results for input(s): CKTOTAL, CKMB, CKMBINDEX, TROPONINI in the last 168 hours. BNP (last 3 results) No results for input(s): PROBNP in the last 8760 hours. HbA1C:  Recent Labs  03/09/17 0301  HGBA1C 13.7*   CBG:  Recent Labs Lab 03/09/17 0803 03/09/17 1140 03/09/17 1616 03/09/17 2111 03/10/17 0738  GLUCAP 244* 328* 284* 248* 255*   Lipid Profile: No results for input(s): CHOL, HDL, LDLCALC,  TRIG, CHOLHDL, LDLDIRECT in the last 72 hours. Thyroid Function Tests:  Recent Labs  03/08/17 2014  TSH 1.273   Anemia Panel: No results for input(s): VITAMINB12, FOLATE, FERRITIN, TIBC, IRON, RETICCTPCT in the last 72 hours. Sepsis Labs: No results for input(s): PROCALCITON, LATICACIDVEN in the last 168 hours.  Recent Results (from the past 240 hour(s))  Urine culture     Status: Abnormal   Collection Time: 03/08/17 10:09 AM  Result Value Ref Range Status   Specimen Description URINE, CLEAN CATCH  Final   Special Requests Normal  Final   Culture >=100,000 COLONIES/mL ESCHERICHIA COLI (A)  Final   Report Status 03/10/2017 FINAL  Final   Organism ID, Bacteria ESCHERICHIA COLI (A)  Final      Susceptibility   Escherichia coli - MIC*    AMPICILLIN >=32 RESISTANT Resistant  CEFAZOLIN 16 SENSITIVE Sensitive     CEFTRIAXONE <=1 SENSITIVE Sensitive     CIPROFLOXACIN <=0.25 SENSITIVE Sensitive     GENTAMICIN <=1 SENSITIVE Sensitive     IMIPENEM <=0.25 SENSITIVE Sensitive     NITROFURANTOIN <=16 SENSITIVE Sensitive     TRIMETH/SULFA >=320 RESISTANT Resistant     AMPICILLIN/SULBACTAM 16 INTERMEDIATE Intermediate     PIP/TAZO <=4 SENSITIVE Sensitive     Extended ESBL NEGATIVE Sensitive     * >=100,000 COLONIES/mL ESCHERICHIA COLI  Wet prep, genital     Status: Abnormal   Collection Time: 03/08/17  3:16 PM  Result Value Ref Range Status   Yeast Wet Prep HPF POC PRESENT (A) NONE SEEN Final   Trich, Wet Prep NONE SEEN NONE SEEN Final   Clue Cells Wet Prep HPF POC NONE SEEN NONE SEEN Final   WBC, Wet Prep HPF POC MANY (A) NONE SEEN Final   Sperm NONE SEEN  Final         Radiology Studies: Ct Renal Stone Study  Result Date: 03/08/2017 CLINICAL DATA:  Left flank pain EXAM: CT ABDOMEN AND PELVIS WITHOUT CONTRAST TECHNIQUE: Multidetector CT imaging of the abdomen and pelvis was performed following the standard protocol without IV contrast. COMPARISON:  None. FINDINGS: Lower  chest: Lung bases are clear. No effusions. Heart is normal size. Hepatobiliary: No focal hepatic abnormality. Gallbladder unremarkable. Pancreas: No focal abnormality or ductal dilatation. Spleen: No focal abnormality.  Normal size. Adrenals/Urinary Tract: Mild fullness of the left renal collecting system and perinephric stranding. No visible ureteral stones no stones or hydronephrosis on the right. Adrenal glands are unremarkable. Mild bladder wall thickening. Stomach/Bowel: Appendix is normal. Stomach, large and small bowel grossly unremarkable. Vascular/Lymphatic: No evidence of aneurysm or adenopathy. Reproductive: Uterus and adnexa unremarkable.  No mass. Other: No free fluid or free air. Musculoskeletal: No acute bony abnormality. IMPRESSION: Perinephric stranding around the left kidney. Mild fullness of the left renal collecting system without visible stones. There is mild bladder wall thickening. Findings may reflect cystitis and pyelonephritis. Recommend correlation with urinalysis. Electronically Signed   By: Rolm Baptise M.D.   On: 03/08/2017 12:28        Scheduled Meds: . enoxaparin (LOVENOX) injection  40 mg Subcutaneous Q24H  . insulin aspart  0-5 Units Subcutaneous QHS  . insulin aspart  0-9 Units Subcutaneous TID WC  . insulin glargine  10 Units Subcutaneous Daily   Continuous Infusions: . sodium chloride 75 mL/hr at 03/10/17 0157  . cefTRIAXone (ROCEPHIN)  IV Stopped (03/09/17 1449)     LOS: 2 days        Larren Copes Gerome Apley, MD [facility] Pager [phone]  If 7PM-7AM, please contact night-coverage [email or web address] Password [email or web address] 03/10/2017, 9:07 AM

## 2017-03-10 NOTE — Progress Notes (Signed)
Per report from nite RN, dorsal left hand was site of IV infiltration.  Bandage in place.  No swelling or discoloration noted.  Tender to palp.  Hand maintained in elevated position and ice pack applied.

## 2017-03-11 DIAGNOSIS — N10 Acute tubulo-interstitial nephritis: Principal | ICD-10-CM

## 2017-03-11 DIAGNOSIS — N390 Urinary tract infection, site not specified: Secondary | ICD-10-CM

## 2017-03-11 DIAGNOSIS — B9629 Other Escherichia coli [E. coli] as the cause of diseases classified elsewhere: Secondary | ICD-10-CM

## 2017-03-11 DIAGNOSIS — E118 Type 2 diabetes mellitus with unspecified complications: Secondary | ICD-10-CM

## 2017-03-11 DIAGNOSIS — Z1612 Extended spectrum beta lactamase (ESBL) resistance: Secondary | ICD-10-CM

## 2017-03-11 DIAGNOSIS — E784 Other hyperlipidemia: Secondary | ICD-10-CM

## 2017-03-11 LAB — LIPID PANEL
CHOL/HDL RATIO: 6.6 ratio
Cholesterol: 105 mg/dL (ref 0–200)
HDL: 16 mg/dL — AB (ref >40)
LDL CALC: 67 mg/dL (ref 0–99)
Triglycerides: 108 mg/dL (ref <150)
VLDL: 22 mg/dL (ref 0–40)

## 2017-03-11 LAB — BASIC METABOLIC PANEL
Anion gap: 8 (ref 5–15)
CHLORIDE: 102 mmol/L (ref 101–111)
CO2: 26 mmol/L (ref 22–32)
CREATININE: 0.73 mg/dL (ref 0.44–1.00)
Calcium: 8.5 mg/dL — ABNORMAL LOW (ref 8.9–10.3)
GFR calc Af Amer: >60 mL/min (ref >60)
GFR calc non Af Amer: >60 mL/min (ref >60)
Glucose, Bld: 199 mg/dL — ABNORMAL HIGH (ref 65–99)
Potassium: 3.8 mmol/L (ref 3.5–5.1)
SODIUM: 136 mmol/L (ref 135–145)

## 2017-03-11 LAB — GLUCOSE, CAPILLARY
GLUCOSE-CAPILLARY: 304 mg/dL — AB (ref 65–99)
Glucose-Capillary: 220 mg/dL — ABNORMAL HIGH (ref 65–99)
Glucose-Capillary: 230 mg/dL — ABNORMAL HIGH (ref 65–99)
Glucose-Capillary: 151 mg/dL — ABNORMAL HIGH (ref 65–99)

## 2017-03-11 MED ORDER — INSULIN ASPART 100 UNIT/ML ~~LOC~~ SOLN
5.0000 [IU] | Freq: Three times a day (TID) | SUBCUTANEOUS | Status: DC
  Administered 2017-03-11 – 2017-03-12 (×3): 5 [IU] via SUBCUTANEOUS

## 2017-03-11 MED ORDER — INSULIN ASPART 100 UNIT/ML ~~LOC~~ SOLN
0.0000 [IU] | SUBCUTANEOUS | Status: DC
  Administered 2017-03-11: 3 [IU] via SUBCUTANEOUS
  Administered 2017-03-11: 11 [IU] via SUBCUTANEOUS
  Administered 2017-03-12: 5 [IU] via SUBCUTANEOUS
  Administered 2017-03-12 (×2): 3 [IU] via SUBCUTANEOUS

## 2017-03-11 MED ORDER — INSULIN DETEMIR 100 UNIT/ML ~~LOC~~ SOLN
20.0000 [IU] | Freq: Every day | SUBCUTANEOUS | Status: DC
  Administered 2017-03-12: 20 [IU] via SUBCUTANEOUS
  Filled 2017-03-11: qty 0.2

## 2017-03-11 NOTE — Progress Notes (Signed)
PROGRESS NOTE    Darlene Watts  XLK:[id number] DOB: 01-Nov-1990 DOA: 03/08/2017 PCP: Patient, No Pcp Per     Brief Narrative:  26 y.o. BF PMHx Diabetes type 2 uncontrolled with complications/DKA , Recurrent UTI, Trichomoniasis, Herpes Simplex type II,   Presented to [facility] ED with concerns that she was going into DKA. Patient states That since Saturday she started feeling bad and lay in bed and lost her appetite. States she developed sharp abdominal Pain that radiated into her Left Flank associated with Nausea. Patient states she may have had subjective fevers but was having chills at night. Patient admits to burning and dysuria, increased urinary frequency, and generalized weakness. States she recently stopped a medication she would take for recurrent UTI's but can't remember the name of it. Of note patient has not been checking her Blood Sugars as her Glucometer has been broken. Denied any CP, Lightheadedness or dizziness but states she "gags" whenever she starts to ambulate. Has had very poor appetite. [facility] was called to admit for early DKA and concern of Pyelonephritis.     Subjective: 8/19  A/O 4, negative CP, negative SOB, negative abdominal pain, negative N/V. Has a broken glucometer and therefore has not been checking her sugars. Admits does not eat a true diabetic diet.   Assessment & Plan:   Active Problems:   Type 2 diabetes mellitus with hyperglycemia (HCC)   Pyelonephritis   Nausea   Hyponatremia   Microcytic anemia   Yeast vaginitis   Cystitis   Acute left pyelonephritis,positive Escherichia coli -Complete five-day treatment antibiotics    Dyspepsia, nausea and vomiting.  -Resolved Next line Will continue antiemetics as needed with zofran and phenergan, will add antiacid therapy with famotidine IV and po sucralfate, continue diabetic diet.    Diabetes type 2 uncontrolled with complication -3/66 Hemoglobin A1c= 13.7 -8/19 Increase Levemir 20 units daily -8/19  start NovoLog 5 units  QAC -8/19 increase to moderate SSI -Consult diabetic coordinator -Consult diabetic nutritionist -Patient will need to be discharged on insulin not oral diabetic medication, given her hemoglobin A1c in failure on PO medications  HLD -Lipid panel pending  Hypokalemia.  -Resolved    Yeast vaginitis.  -SP fluconazole x11 dose.     DVT prophylaxis: Lovenox Code Status: Full Family Communication: None Disposition Plan: Discharge home next 24-48 hours   Consultants:  Diabetic coordinator pending Diabetic nutritionist pending    Procedures/Significant Events:  None   I have personally reviewed and interpreted all radiology studies and my findings are as above.  VENTILATOR SETTINGS: None   Cultures 8/16 urine positive Escherichia coli 8/16 genital positive yeast   Antimicrobials: Anti-infectives    Start     Stop   03/09/17 1400  cefTRIAXone (ROCEPHIN) 1 g in dextrose 5 % 50 mL IVPB         03/08/17 1645  cefTRIAXone (ROCEPHIN) 1 g in dextrose 5 % 50 mL IVPB     03/08/17 1720   03/08/17 1615  fluconazole (DIFLUCAN) tablet 150 mg     03/08/17 1619       Devices    LINES / TUBES:      Continuous Infusions: . cefTRIAXone (ROCEPHIN)  IV Stopped (03/10/17 1530)  . famotidine (PEPCID) IV 20 mg (03/11/17 0505)     Objective: Vitals:   03/10/17 0900 03/10/17 2104 03/11/17 0508 03/11/17 0855  BP: (!) 100/56 (!) 97/55 (!) 103/57 (!) 109/56  Pulse: 72 66 78 60  Resp: 18 19 20  16  Temp: 98.4 F (36.9 C) 99 F (37.2 C) 98.9 F (37.2 C) 98.9 F (37.2 C)  TempSrc: Oral Oral Oral Oral  SpO2: 100% 99% 98% 99%  Weight:  201 lb 1 oz (91.2 kg)    Height:        Intake/Output Summary (Last 24 hours) at 03/11/17 1203 Last data filed at 03/11/17 5573  Gross per 24 hour  Intake              652 ml  Output                1 ml  Net              651 ml   Filed Weights   03/10/17 0500 03/10/17 0549 03/10/17 2104  Weight: 198 lb 6.6 oz  (90 kg) 200 lb 13.4 oz (91.1 kg) 201 lb 1 oz (91.2 kg)    Examination:  General: A/O 4, No acute respiratory distress Eyes: negative scleral hemorrhage, negative anisocoria, negative icterus Lungs: Clear to auscultation bilaterally without wheezes or crackles Cardiovascular: Regular rate and rhythm without murmur gallop or rub normal S1 and S2 Abdomen: Morbidly obese, negative abdominal pain, nondistended, positive soft, bowel sounds, no rebound, no ascites, no appreciable mass Extremities: No significant cyanosis, clubbing, or edema bilateral lower extremities Skin: Negative rashes, lesions, ulcers Psychiatric:  Negative depression, negative anxiety, negative fatigue, negative mania  Central nervous system:  Cranial nerves II through XII intact, tongue/uvula midline, all extremities muscle strength 5/5, sensation intact throughout, negative dysarthria, negative expressive aphasia, negative receptive aphasia.  .     Data Reviewed: Care during the described time interval was provided by me .  I have reviewed this patient's available data, including medical history, events of note, physical examination, and all test results as part of my evaluation.  CBC:  Recent Labs Lab 03/08/17 0830 03/09/17 0301 03/10/17 0254 03/10/17 1008  WBC 11.1* 9.6 7.5 6.8  NEUTROABS  --   --  4.3 4.1  HGB 11.8* 10.2* 10.3* 9.8*  HCT 34.2* 30.8* 31.3* 29.6*  MCV 77.6* 77.6* 78.1 77.9*  PLT 294 259 304 220   Basic Metabolic Panel:  Recent Labs Lab 03/08/17 0830 03/09/17 0301 03/10/17 0254 03/11/17 0315  NA 131* 135 136 136  K 3.9 3.5 4.0 3.8  CL 97* 103 103 102  CO2 22 25 27 26   GLUCOSE 443* 187* 280* 199*  BUN <5* <5* <5* <5*  CREATININE 0.89 0.70 0.69 0.73  CALCIUM 8.8* 8.1* 8.4* 8.5*  MG  --  1.8  --   --   PHOS  --  3.0  --   --    GFR: Estimated Creatinine Clearance: 113.8 mL/min (by C-G formula based on SCr of 0.73 mg/dL). Liver Function Tests:  Recent Labs Lab 03/08/17 1131  03/09/17 0301  AST 13* 11*  ALT 12* 11*  ALKPHOS 58 49  BILITOT 0.8 0.7  PROT 7.3 6.3*  ALBUMIN 3.3* 2.7*   No results for input(s): LIPASE, AMYLASE in the last 168 hours. No results for input(s): AMMONIA in the last 168 hours. Coagulation Profile: No results for input(s): INR, PROTIME in the last 168 hours. Cardiac Enzymes: No results for input(s): CKTOTAL, CKMB, CKMBINDEX, TROPONINI in the last 168 hours. BNP (last 3 results) No results for input(s): PROBNP in the last 8760 hours. HbA1C:  Recent Labs  03/09/17 0301  HGBA1C 13.7*   CBG:  Recent Labs Lab 03/10/17 1228 03/10/17 1655 03/10/17 2104 03/11/17 0735 03/11/17  [place]* 256* 262* 220* 230*   Lipid Profile: No results for input(s): CHOL, HDL, LDLCALC, TRIG, CHOLHDL, LDLDIRECT in the last 72 hours. Thyroid Function Tests:  Recent Labs  03/08/17 2014  TSH 1.273   Anemia Panel: No results for input(s): VITAMINB12, FOLATE, FERRITIN, TIBC, IRON, RETICCTPCT in the last 72 hours. Sepsis Labs: No results for input(s): PROCALCITON, LATICACIDVEN in the last 168 hours.  Recent Results (from the past 240 hour(s))  Urine culture     Status: Abnormal   Collection Time: 03/08/17 10:09 AM  Result Value Ref Range Status   Specimen Description URINE, CLEAN CATCH  Final   Special Requests Normal  Final   Culture >=100,000 COLONIES/mL ESCHERICHIA COLI (A)  Final   Report Status 03/10/2017 FINAL  Final   Organism ID, Bacteria ESCHERICHIA COLI (A)  Final      Susceptibility   Escherichia coli - MIC*    AMPICILLIN >=32 RESISTANT Resistant     CEFAZOLIN 16 SENSITIVE Sensitive     CEFTRIAXONE <=1 SENSITIVE Sensitive     CIPROFLOXACIN <=0.25 SENSITIVE Sensitive     GENTAMICIN <=1 SENSITIVE Sensitive     IMIPENEM <=0.25 SENSITIVE Sensitive     NITROFURANTOIN <=16 SENSITIVE Sensitive     TRIMETH/SULFA >=320 RESISTANT Resistant     AMPICILLIN/SULBACTAM 16 INTERMEDIATE Intermediate     PIP/TAZO <=4 SENSITIVE  Sensitive     Extended ESBL NEGATIVE Sensitive     * >=100,000 COLONIES/mL ESCHERICHIA COLI  Wet prep, genital     Status: Abnormal   Collection Time: 03/08/17  3:16 PM  Result Value Ref Range Status   Yeast Wet Prep HPF POC PRESENT (A) NONE SEEN Final   Trich, Wet Prep NONE SEEN NONE SEEN Final   Clue Cells Wet Prep HPF POC NONE SEEN NONE SEEN Final   WBC, Wet Prep HPF POC MANY (A) NONE SEEN Final   Sperm NONE SEEN  Final         Radiology Studies: No results found.      Scheduled Meds: . enoxaparin (LOVENOX) injection  40 mg Subcutaneous Q24H  . insulin aspart  0-5 Units Subcutaneous QHS  . insulin aspart  0-9 Units Subcutaneous TID WC  . insulin detemir  15 Units Subcutaneous Daily  . sucralfate  1 g Oral TID WC & HS   Continuous Infusions: . cefTRIAXone (ROCEPHIN)  IV Stopped (03/10/17 1530)  . famotidine (PEPCID) IV 20 mg (03/11/17 0505)     LOS: 3 days    Time spent:40 min    WOODS, Geraldo Docker, MD [facility] Pager [phone]  If 7PM-7AM, please contact night-coverage [email or web address] Password [facility] 03/11/2017, 12:03 PM

## 2017-03-12 ENCOUNTER — Encounter (HOSPITAL_COMMUNITY): Payer: Self-pay

## 2017-03-12 DIAGNOSIS — E876 Hypokalemia: Secondary | ICD-10-CM

## 2017-03-12 LAB — GLUCOSE, CAPILLARY
Glucose-Capillary: 168 mg/dL — ABNORMAL HIGH (ref 65–99)
Glucose-Capillary: 153 mg/dL — ABNORMAL HIGH (ref 65–99)
Glucose-Capillary: 212 mg/dL — ABNORMAL HIGH (ref 65–99)

## 2017-03-12 MED ORDER — ACETAMINOPHEN 500 MG PO TABS: 500.0000 mg | ORAL_TABLET | Freq: Four times a day (QID) | ORAL | 0 refills | Status: DC | PRN

## 2017-03-12 MED ORDER — SUCRALFATE 1 G PO TABS: 1.0000 g | ORAL_TABLET | Freq: Three times a day (TID) | ORAL | 0 refills | Status: DC

## 2017-03-12 MED ORDER — CIPROFLOXACIN HCL 250 MG PO TABS: 250.0000 mg | ORAL_TABLET | Freq: Two times a day (BID) | ORAL | 0 refills | Status: AC

## 2017-03-12 MED ORDER — FAMOTIDINE 40 MG PO TABS: 40.0000 mg | ORAL_TABLET | Freq: Every day | ORAL | 0 refills | Status: DC

## 2017-03-12 MED ORDER — ONDANSETRON HCL 4 MG PO TABS: 4.0000 mg | ORAL_TABLET | Freq: Four times a day (QID) | ORAL | 0 refills | Status: DC | PRN

## 2017-03-12 NOTE — Progress Notes (Signed)
Patient discharged home. AVS and scripts reviewed with patient. Pt acknowledged understanding. Dietician and diabetes coordinator educated patient as well. VSS.

## 2017-03-12 NOTE — Progress Notes (Signed)
Patient c/o pain in left PIV, area is red and swollen, removed per request and consulted iv team to restart PIV.  Patient refusing iv access at this time.  She is tired of getting stuck.

## 2017-03-12 NOTE — Discharge Summary (Signed)
Physician Discharge Summary  Darlene Watts VHQ:[id number] DOB: Mar 23, 1991 DOA: 03/08/2017  PCP: Patient, No Pcp Per  Admit date: 03/08/2017 Discharge date: 03/12/2017  Admitted From: Home  Disposition:  Home   Recommendations for Outpatient Follow-up:  1. Follow up with PCP in 1- week 2. Patient placed on ciprofloxacin for 7 days 3. Patient started on sucralfate and famotidine  Home Health": No  Equipment/Devices: No   Discharge Condition: Stable  CODE STATUS: Full   Diet recommendation: Diabetic  Brief/Interim Summary: 26 year old female who presented with abdominal/flank pain. Patient is known to have type 1 diabetes mellitus. For the last 5 days prior to hospitalization patient had generalized malaise, decreased appetite, sharp abdominal pain, with radiation to the left flank, associated with nausea, subjective fevers, dysuria, and increased urine frequency. On the initial physical examination blood pressure 113/66, heart rate 89, respiratory rate 17, oxygen saturation 100%. Moist mucous membranes, lungs were clear to auscultation bilaterally, heart S1-S2 present rhythmic, no gallops, rubs or murmurs, the abdomen was soft, tender to palpation on left upper quadrant, left lower quadrant, suprapubic region. No rebound or peritoneal signs. No lower extremity edema. Sodium 131, potassium 3.9, chloride 97, bicarbonate 22, glucose of 443, BUN less than 5, creatinine 0.89, anion gap 12,white count 11.1, hemoglobin 11.8 hematocrit 34.2, platelets 294. Urine analysis with too numerous to count white cells, 6-30 RBCs, greater than 500 glucose, specific gravity 1.023. CT of the abdomen with a perinephric stranding around left kidney, with mild bladder wall thickening.   Patient admitted to the hospital with the working diagnosis of acute pyelonephritis, complicated by hyperglycemia.   1. Acute pyelonephritis, due to Escherichia coli present on admission. Patient was admitted to the medical ward, she  was placed on IV antibiotic therapy with ceftriaxone, her urine culture grew positive for Escherichia coli, sensitive to cephalosporins and fluoroquinolones. She responded well to medical therapy, patient will be discharged on ciprofloxacin for the next 7 days.  2. Dyspepsia, with nausea and vomiting. Patient was placed on antiacid therapy with sucralfate and famotidine,  as needed Zofran and Phenergan, for nausea and vomiting. Her diet was advanced progressively with good toleration. Patient will be discharged on famotidine, sucralfate and estimate his Zofran.  3. Type 2 diabetes mellitus with uncontrolled hyperglycemia. Patient was placed on aggressive insulin therapy with short and long-acting agents. Her capillary glucose over last 24 hours 220, 230, 304, 151, 168, 153. Hemoglobin A1c 13.7, patient will resume her oral hypoglycemic agent, glipizide and insulin Levemir, 60 units daily and Exenatide 2 mg q Sunday.   4. Transient hypokalemia. Potassium was corrected with potassium chloride, her discharge potassium is 3.8, preserved kidney function with serum creatinine 0.73.  5. Yeast vaginitis. Patient received 1 dose of fluconazole. HIV screen nonreactive.   Discharge Diagnoses:  Active Problems:   Type 2 diabetes mellitus with hyperglycemia (HCC)   Pyelonephritis   Nausea   Hyponatremia   Microcytic anemia   Yeast vaginitis   Cystitis    Discharge Instructions   Allergies as of 03/12/2017      Reactions   Amoxicillin Hives   Has patient had a PCN reaction causing immediate rash, facial/tongue/throat swelling, SOB or lightheadedness with hypotension: Yes Has patient had a PCN reaction causing severe rash involving mucus membranes or skin necrosis: No Has patient had a PCN reaction that required hospitalization: No Has patient had a PCN reaction occurring within the last 10 years: Yes If all of the above answers are "NO", then may proceed with Cephalosporin  use.      Medication  List    STOP taking these medications   cyclobenzaprine 10 MG tablet Commonly known as:  FLEXERIL   ibuprofen 600 MG tablet Commonly known as:  ADVIL,MOTRIN   LANTUS SOLOSTAR 100 UNIT/ML Solostar Pen Generic drug:  Insulin Glargine   metFORMIN 1000 MG tablet Commonly known as:  GLUCOPHAGE   phenazopyridine 200 MG tablet Commonly known as:  PYRIDIUM   traMADol 50 MG tablet Commonly known as:  ULTRAM     TAKE these medications   acetaminophen 500 MG tablet Commonly known as:  TYLENOL Take 1 tablet (500 mg total) by mouth every 6 (six) hours as needed for moderate pain.   BYDUREON BCISE 2 MG/0.85ML Auij Generic drug:  Exenatide ER Inject 2 mg into the skin every Sunday.   ciprofloxacin 250 MG tablet Commonly known as:  CIPRO Take 1 tablet (250 mg total) by mouth 2 (two) times daily.   famotidine 40 MG tablet Commonly known as:  PEPCID Take 1 tablet (40 mg total) by mouth daily.   glipiZIDE 10 MG tablet Commonly known as:  GLUCOTROL Take 1 tablet (10 mg total) by mouth 2 (two) times daily before a meal.   insulin detemir 100 UNIT/ML injection Commonly known as:  LEVEMIR Inject 60 Units into the skin at bedtime.   loratadine 10 MG tablet Commonly known as:  CLARITIN Take 10 mg by mouth daily as needed for allergies.   ondansetron 4 MG tablet Commonly known as:  ZOFRAN Take 1 tablet (4 mg total) by mouth every 6 (six) hours as needed for nausea. What changed:  when to take this  reasons to take this   sucralfate 1 g tablet Commonly known as:  CARAFATE Take 1 tablet (1 g total) by mouth 4 (four) times daily -  with meals and at bedtime.       Allergies  Allergen Reactions  . Amoxicillin Hives    Has patient had a PCN reaction causing immediate rash, facial/tongue/throat swelling, SOB or lightheadedness with hypotension: Yes Has patient had a PCN reaction causing severe rash involving mucus membranes or skin necrosis: No Has patient had a PCN reaction  that required hospitalization: No Has patient had a PCN reaction occurring within the last 10 years: Yes If all of the above answers are "NO", then may proceed with Cephalosporin use.     Consultations:     Procedures/Studies: Ct Renal Stone Study  Result Date: 03/08/2017 CLINICAL DATA:  Left flank pain EXAM: CT ABDOMEN AND PELVIS WITHOUT CONTRAST TECHNIQUE: Multidetector CT imaging of the abdomen and pelvis was performed following the standard protocol without IV contrast. COMPARISON:  None. FINDINGS: Lower chest: Lung bases are clear. No effusions. Heart is normal size. Hepatobiliary: No focal hepatic abnormality. Gallbladder unremarkable. Pancreas: No focal abnormality or ductal dilatation. Spleen: No focal abnormality.  Normal size. Adrenals/Urinary Tract: Mild fullness of the left renal collecting system and perinephric stranding. No visible ureteral stones no stones or hydronephrosis on the right. Adrenal glands are unremarkable. Mild bladder wall thickening. Stomach/Bowel: Appendix is normal. Stomach, large and small bowel grossly unremarkable. Vascular/Lymphatic: No evidence of aneurysm or adenopathy. Reproductive: Uterus and adnexa unremarkable.  No mass. Other: No free fluid or free air. Musculoskeletal: No acute bony abnormality. IMPRESSION: Perinephric stranding around the left kidney. Mild fullness of the left renal collecting system without visible stones. There is mild bladder wall thickening. Findings may reflect cystitis and pyelonephritis. Recommend correlation with urinalysis. Electronically Signed  By: Rolm Baptise M.D.   On: 03/08/2017 12:28       Subjective: Patient feeling better, no nausea or vomiting, mild pain on the right arm, improved after removing peripheral iv access.   Discharge Exam: Vitals:   03/12/17 0540 03/12/17 0922  BP: 108/67 111/64  Pulse: 63 72  Resp: 18 17  Temp: 98.7 F (37.1 C) 98.5 F (36.9 C)  SpO2: 100% 99%   Vitals:   03/11/17 1721  03/11/17 2145 03/12/17 0540 03/12/17 0922  BP: 113/73 (!) 111/59 108/67 111/64  Pulse: 84 (!) 101 63 72  Resp: 18 18 18 17   Temp: 98.7 F (37.1 C) 99.5 F (37.5 C) 98.7 F (37.1 C) 98.5 F (36.9 C)  TempSrc: Oral Oral Oral Oral  SpO2: 100% 100% 100% 99%  Weight:  89.8 kg (198 lb)    Height:        General: Pt is alert, awake, not in acute distress E ENT. Mild pallor, no icterus Cardiovascular: RRR, S1/S2 +, no rubs, no gallops Respiratory: CTA bilaterally, no wheezing, no rhonchi Abdominal: Soft, NT, ND, bowel sounds + Extremities: no edema, no cyanosis    The results of significant diagnostics from this hospitalization (including imaging, microbiology, ancillary and laboratory) are listed below for reference.     Microbiology: Recent Results (from the past 240 hour(s))  Urine culture     Status: Abnormal   Collection Time: 03/08/17 10:09 AM  Result Value Ref Range Status   Specimen Description URINE, CLEAN CATCH  Final   Special Requests Normal  Final   Culture >=100,000 COLONIES/mL ESCHERICHIA COLI (A)  Final   Report Status 03/10/2017 FINAL  Final   Organism ID, Bacteria ESCHERICHIA COLI (A)  Final      Susceptibility   Escherichia coli - MIC*    AMPICILLIN >=32 RESISTANT Resistant     CEFAZOLIN 16 SENSITIVE Sensitive     CEFTRIAXONE <=1 SENSITIVE Sensitive     CIPROFLOXACIN <=0.25 SENSITIVE Sensitive     GENTAMICIN <=1 SENSITIVE Sensitive     IMIPENEM <=0.25 SENSITIVE Sensitive     NITROFURANTOIN <=16 SENSITIVE Sensitive     TRIMETH/SULFA >=320 RESISTANT Resistant     AMPICILLIN/SULBACTAM 16 INTERMEDIATE Intermediate     PIP/TAZO <=4 SENSITIVE Sensitive     Extended ESBL NEGATIVE Sensitive     * >=100,000 COLONIES/mL ESCHERICHIA COLI  Wet prep, genital     Status: Abnormal   Collection Time: 03/08/17  3:16 PM  Result Value Ref Range Status   Yeast Wet Prep HPF POC PRESENT (A) NONE SEEN Final   Trich, Wet Prep NONE SEEN NONE SEEN Final   Clue Cells Wet Prep  HPF POC NONE SEEN NONE SEEN Final   WBC, Wet Prep HPF POC MANY (A) NONE SEEN Final   Sperm NONE SEEN  Final     Labs: BNP (last 3 results) No results for input(s): BNP in the last 8760 hours. Basic Metabolic Panel:  Recent Labs Lab 03/08/17 0830 03/09/17 0301 03/10/17 0254 03/11/17 0315  NA 131* 135 136 136  K 3.9 3.5 4.0 3.8  CL 97* 103 103 102  CO2 22 25 27 26   GLUCOSE 443* 187* 280* 199*  BUN <5* <5* <5* <5*  CREATININE 0.89 0.70 0.69 0.73  CALCIUM 8.8* 8.1* 8.4* 8.5*  MG  --  1.8  --   --   PHOS  --  3.0  --   --    Liver Function Tests:  Recent Labs Lab 03/08/17  1131 03/09/17 0301  AST 13* 11*  ALT 12* 11*  ALKPHOS 58 49  BILITOT 0.8 0.7  PROT 7.3 6.3*  ALBUMIN 3.3* 2.7*   No results for input(s): LIPASE, AMYLASE in the last 168 hours. No results for input(s): AMMONIA in the last 168 hours. CBC:  Recent Labs Lab 03/08/17 0830 03/09/17 0301 03/10/17 0254 03/10/17 1008  WBC 11.1* 9.6 7.5 6.8  NEUTROABS  --   --  4.3 4.1  HGB 11.8* 10.2* 10.3* 9.8*  HCT 34.2* 30.8* 31.3* 29.6*  MCV 77.6* 77.6* 78.1 77.9*  PLT 294 259 304 269   Cardiac Enzymes: No results for input(s): CKTOTAL, CKMB, CKMBINDEX, TROPONINI in the last 168 hours. BNP: Invalid input(s): POCBNP CBG:  Recent Labs Lab 03/11/17 1149 03/11/17 1725 03/11/17 2150 03/12/17 0534 03/12/17 0757  GLUCAP 230* 304* 151* 168* 153*   D-Dimer No results for input(s): DDIMER in the last 72 hours. Hgb A1c No results for input(s): HGBA1C in the last 72 hours. Lipid Profile  Recent Labs  03/11/17 1220  CHOL 105  HDL 16*  LDLCALC 67  TRIG 108  CHOLHDL 6.6   Thyroid function studies No results for input(s): TSH, T4TOTAL, T3FREE, THYROIDAB in the last 72 hours.  Invalid input(s): FREET3 Anemia work up No results for input(s): VITAMINB12, FOLATE, FERRITIN, TIBC, IRON, RETICCTPCT in the last 72 hours. Urinalysis    Component Value Date/Time   COLORURINE YELLOW 03/08/2017 1009    APPEARANCEUR CLOUDY (A) 03/08/2017 1009   LABSPEC 1.023 03/08/2017 1009   PHURINE 6.0 03/08/2017 1009   GLUCOSEU >=500 (A) 03/08/2017 1009   HGBUR MODERATE (A) 03/08/2017 1009   BILIRUBINUR NEGATIVE 03/08/2017 1009   KETONESUR 5 (A) 03/08/2017 1009   PROTEINUR NEGATIVE 03/08/2017 1009   UROBILINOGEN 0.2 09/14/2014 1205   NITRITE NEGATIVE 03/08/2017 1009   LEUKOCYTESUR LARGE (A) 03/08/2017 1009   Sepsis Labs Invalid input(s): PROCALCITONIN,  WBC,  LACTICIDVEN Microbiology Recent Results (from the past 240 hour(s))  Urine culture     Status: Abnormal   Collection Time: 03/08/17 10:09 AM  Result Value Ref Range Status   Specimen Description URINE, CLEAN CATCH  Final   Special Requests Normal  Final   Culture >=100,000 COLONIES/mL ESCHERICHIA COLI (A)  Final   Report Status 03/10/2017 FINAL  Final   Organism ID, Bacteria ESCHERICHIA COLI (A)  Final      Susceptibility   Escherichia coli - MIC*    AMPICILLIN >=32 RESISTANT Resistant     CEFAZOLIN 16 SENSITIVE Sensitive     CEFTRIAXONE <=1 SENSITIVE Sensitive     CIPROFLOXACIN <=0.25 SENSITIVE Sensitive     GENTAMICIN <=1 SENSITIVE Sensitive     IMIPENEM <=0.25 SENSITIVE Sensitive     NITROFURANTOIN <=16 SENSITIVE Sensitive     TRIMETH/SULFA >=320 RESISTANT Resistant     AMPICILLIN/SULBACTAM 16 INTERMEDIATE Intermediate     PIP/TAZO <=4 SENSITIVE Sensitive     Extended ESBL NEGATIVE Sensitive     * >=100,000 COLONIES/mL ESCHERICHIA COLI  Wet prep, genital     Status: Abnormal   Collection Time: 03/08/17  3:16 PM  Result Value Ref Range Status   Yeast Wet Prep HPF POC PRESENT (A) NONE SEEN Final   Trich, Wet Prep NONE SEEN NONE SEEN Final   Clue Cells Wet Prep HPF POC NONE SEEN NONE SEEN Final   WBC, Wet Prep HPF POC MANY (A) NONE SEEN Final   Sperm NONE SEEN  Final     Time coordinating discharge: Over 45 minutes  SIGNED:   Tawni Millers, MD  [facility] 03/12/2017, 9:35 AM Pager [phone]  If  7PM-7AM, please contact night-coverage [email or web address] Password [email or web address]

## 2017-03-12 NOTE — Progress Notes (Signed)
Inpatient Diabetes Program Recommendations  AACE/ADA: New Consensus Statement on Inpatient Glycemic Control (2015)  Target Ranges:  Prepandial:   less than 140 mg/dL      Peak postprandial:   less than 180 mg/dL (1-2 hours)      Critically ill patients:  140 - 180 mg/dL   Lab Results  Component Value Date   GLUCAP 153 (H) 03/12/2017   HGBA1C 13.7 (H) 03/09/2017    Review of Glycemic Control  Spoke with pt about her HgbA1C of 13.7%. Spoke with patient about diabetes and home regimen for diabetes control. Pt has not checked her blood sugars on a regular basis as her meter needs new battery. Has appt with Dr. Buddy Duty in approx 2 months and is on the waiting list for cancellations. Discussed protein sources besides meat, such as beans, eggs. Stressed importance of checking blood sugars 3-4x/day and keeping logbook. Take logbook to MD appt for any needed adjustments. To be discharged on Exenatide 2 mg Q Sunday, glipizide 10 mg bid, Levemir 60 units QHS. Answered all questions and pt voiced understanding. States she should not have trouble getting meds with new insurance.  Thank you. Lorenda Peck, RD, LDN, CDE Inpatient Diabetes Coordinator [phone]

## 2017-03-12 NOTE — Plan of Care (Signed)
Problem: Food- and Nutrition-Related Knowledge Deficit (NB-1.1) Goal: Nutrition education Formal process to instruct or train a patient/client in a skill or to impart knowledge to help patients/clients voluntarily manage or modify food choices and eating behavior to maintain or improve health. Outcome: Completed/Met Date Met: 03/12/17  RD consulted for nutrition education regarding diabetes.   Lab Results  Component Value Date   HGBA1C 13.7 (H) 03/09/2017    RD provided "Carbohydrate Counting for People with Diabetes" handout from the Academy of [employer] Dietetics. Discussed different food groups and their effects on blood sugar, emphasizing carbohydrate-containing foods. Provided list of carbohydrates and recommended serving sizes of common foods.  Pt reports she was vegan, now vegetarian (eats eggs, cheese) and is thinking about beginning to incorporate fish/shellfish. Reviewed sources of protein for a vegetarian/pescetarian diet and how it's important to eat a protein source with each meal/snack.   Discussed importance of controlled and consistent carbohydrate intake throughout the day. Provided examples of ways to balance meals/snacks and encouraged intake of high-fiber, whole grain complex carbohydrates. Teach back method used.  Expect good compliance.  Body mass index is 36.21 kg/m. Pt meets criteria for obesity unspecified based on current BMI.  Current diet order is Carb Modified, patient is consuming approximately 100% of meals at this time. Labs and medications reviewed. No further nutrition intervention warranted at this time. Noted plan for discharge today. RD contact information provided. If additional nutrition issues arise, please re-consult RD.  Kerman Passey MS, RD, LDN [phone] Pager  [phone] Weekend/On-Call Pager

## 2017-04-10 DIAGNOSIS — N390 Urinary tract infection, site not specified: Secondary | ICD-10-CM | POA: Diagnosis not present

## 2017-04-10 DIAGNOSIS — E876 Hypokalemia: Secondary | ICD-10-CM | POA: Diagnosis not present

## 2017-04-10 DIAGNOSIS — E109 Type 1 diabetes mellitus without complications: Secondary | ICD-10-CM | POA: Diagnosis not present

## 2017-04-10 DIAGNOSIS — N10 Acute tubulo-interstitial nephritis: Secondary | ICD-10-CM | POA: Diagnosis not present

## 2017-04-24 DIAGNOSIS — N10 Acute tubulo-interstitial nephritis: Secondary | ICD-10-CM | POA: Diagnosis not present

## 2017-04-24 DIAGNOSIS — N390 Urinary tract infection, site not specified: Secondary | ICD-10-CM | POA: Diagnosis not present

## 2017-05-01 DIAGNOSIS — E109 Type 1 diabetes mellitus without complications: Secondary | ICD-10-CM | POA: Diagnosis not present

## 2017-05-01 DIAGNOSIS — Z833 Family history of diabetes mellitus: Secondary | ICD-10-CM | POA: Diagnosis not present

## 2017-05-01 DIAGNOSIS — Z794 Long term (current) use of insulin: Secondary | ICD-10-CM | POA: Diagnosis not present

## 2017-06-20 DIAGNOSIS — Z794 Long term (current) use of insulin: Secondary | ICD-10-CM | POA: Diagnosis not present

## 2017-06-20 DIAGNOSIS — E109 Type 1 diabetes mellitus without complications: Secondary | ICD-10-CM | POA: Diagnosis not present

## 2017-06-20 DIAGNOSIS — Z833 Family history of diabetes mellitus: Secondary | ICD-10-CM | POA: Diagnosis not present

## 2017-06-20 DIAGNOSIS — R300 Dysuria: Secondary | ICD-10-CM | POA: Diagnosis not present

## 2017-08-02 DIAGNOSIS — R197 Diarrhea, unspecified: Secondary | ICD-10-CM | POA: Diagnosis not present

## 2017-08-07 ENCOUNTER — Ambulatory Visit: Payer: 59 | Admitting: Registered"

## 2017-08-13 DIAGNOSIS — Z7689 Persons encountering health services in other specified circumstances: Secondary | ICD-10-CM | POA: Diagnosis not present

## 2017-10-24 DIAGNOSIS — E109 Type 1 diabetes mellitus without complications: Secondary | ICD-10-CM | POA: Diagnosis not present

## 2017-10-24 DIAGNOSIS — R87610 Atypical squamous cells of undetermined significance on cytologic smear of cervix (ASC-US): Secondary | ICD-10-CM | POA: Diagnosis not present

## 2017-10-24 DIAGNOSIS — Z01419 Encounter for gynecological examination (general) (routine) without abnormal findings: Secondary | ICD-10-CM | POA: Diagnosis not present

## 2017-10-24 DIAGNOSIS — N926 Irregular menstruation, unspecified: Secondary | ICD-10-CM | POA: Diagnosis not present

## 2017-10-24 DIAGNOSIS — N390 Urinary tract infection, site not specified: Secondary | ICD-10-CM | POA: Diagnosis not present

## 2017-12-05 DIAGNOSIS — R87610 Atypical squamous cells of undetermined significance on cytologic smear of cervix (ASC-US): Secondary | ICD-10-CM | POA: Diagnosis not present

## 2017-12-05 DIAGNOSIS — Z3202 Encounter for pregnancy test, result negative: Secondary | ICD-10-CM | POA: Diagnosis not present

## 2017-12-05 DIAGNOSIS — N871 Moderate cervical dysplasia: Secondary | ICD-10-CM | POA: Diagnosis not present

## 2017-12-25 DIAGNOSIS — R87610 Atypical squamous cells of undetermined significance on cytologic smear of cervix (ASC-US): Secondary | ICD-10-CM | POA: Diagnosis not present

## 2017-12-25 DIAGNOSIS — N870 Mild cervical dysplasia: Secondary | ICD-10-CM | POA: Diagnosis not present

## 2017-12-25 DIAGNOSIS — Z3202 Encounter for pregnancy test, result negative: Secondary | ICD-10-CM | POA: Diagnosis not present

## 2018-01-08 DIAGNOSIS — Z3202 Encounter for pregnancy test, result negative: Secondary | ICD-10-CM | POA: Diagnosis not present

## 2018-01-09 DIAGNOSIS — E109 Type 1 diabetes mellitus without complications: Secondary | ICD-10-CM | POA: Diagnosis not present

## 2018-01-09 DIAGNOSIS — Z833 Family history of diabetes mellitus: Secondary | ICD-10-CM | POA: Diagnosis not present

## 2018-01-09 DIAGNOSIS — Z794 Long term (current) use of insulin: Secondary | ICD-10-CM | POA: Diagnosis not present

## 2018-02-12 ENCOUNTER — Ambulatory Visit: Payer: 59 | Admitting: *Deleted

## 2018-03-19 ENCOUNTER — Encounter: Payer: 59 | Attending: Internal Medicine | Admitting: *Deleted

## 2018-03-19 DIAGNOSIS — E109 Type 1 diabetes mellitus without complications: Secondary | ICD-10-CM | POA: Insufficient documentation

## 2018-03-19 DIAGNOSIS — Z794 Long term (current) use of insulin: Secondary | ICD-10-CM

## 2018-03-19 DIAGNOSIS — Z713 Dietary counseling and surveillance: Secondary | ICD-10-CM | POA: Insufficient documentation

## 2018-03-19 DIAGNOSIS — E1165 Type 2 diabetes mellitus with hyperglycemia: Secondary | ICD-10-CM

## 2018-03-19 NOTE — Progress Notes (Signed)
Insulin Pump and / or CGM Evaluation Visit:  Date: 03/19/2018   Appt start time: 1530 end time:  1700.  Assessment:  This patient has DM and their primary concerns today: to learn more about insulin pumps. She works 2 jobs and misses her insulin injections about 30% of the time, so having a pump would enable her to get more of her needed insulin. .  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  Demonstrated improved insulin delivery via pump due to improved accuracy of dose and flexibility of adjusting bolus insulin based on carb intake and BG correction.  Showed patient the following pumps: Medtronic, OmniPod,   Showed patient the following CGM: Medtronic, Dexcom and Libre  Demonstrated pump, insulin reservoir and infusion set options, and button pushing for bolus delivery of insulin through the pump  Explained importance of testing BG at least 4 times per day for appropriate correction of high BG and prevention of DKA as applicable.   Handouts given during visit include:  Insulin Action handout  Diabetes Medication handout  Introduction to Pump Therapy Handout  Insulin Pump and /or CGM Packet from [employer]  Carb Counting yellow card per patient request  Monitoring/Evaluation:     Patient to contact local [place] so they can start the process  of obtaining the pump. Contact information provided to the patient.  Follow up upon request as needed

## 2018-04-12 DIAGNOSIS — E109 Type 1 diabetes mellitus without complications: Secondary | ICD-10-CM | POA: Diagnosis not present

## 2018-04-12 DIAGNOSIS — Z794 Long term (current) use of insulin: Secondary | ICD-10-CM | POA: Diagnosis not present

## 2018-04-12 DIAGNOSIS — Z833 Family history of diabetes mellitus: Secondary | ICD-10-CM | POA: Diagnosis not present

## 2018-04-23 DIAGNOSIS — R300 Dysuria: Secondary | ICD-10-CM | POA: Diagnosis not present

## 2018-04-23 DIAGNOSIS — B373 Candidiasis of vulva and vagina: Secondary | ICD-10-CM | POA: Diagnosis not present

## 2018-04-23 DIAGNOSIS — N390 Urinary tract infection, site not specified: Secondary | ICD-10-CM | POA: Diagnosis not present

## 2018-05-12 DIAGNOSIS — M542 Cervicalgia: Secondary | ICD-10-CM | POA: Diagnosis not present

## 2018-05-12 DIAGNOSIS — M546 Pain in thoracic spine: Secondary | ICD-10-CM | POA: Diagnosis not present

## 2018-05-12 DIAGNOSIS — S161XXA Strain of muscle, fascia and tendon at neck level, initial encounter: Secondary | ICD-10-CM | POA: Diagnosis not present

## 2018-05-24 ENCOUNTER — Encounter: Payer: Self-pay | Admitting: Family Medicine

## 2018-05-24 ENCOUNTER — Ambulatory Visit: Payer: 59 | Admitting: Family Medicine

## 2018-05-24 VITALS — BP 104/70 | HR 98 | Temp 98.2°F | Ht 62.0 in | Wt 224.0 lb

## 2018-05-24 DIAGNOSIS — Z794 Long term (current) use of insulin: Secondary | ICD-10-CM | POA: Diagnosis not present

## 2018-05-24 DIAGNOSIS — M545 Low back pain: Secondary | ICD-10-CM | POA: Diagnosis not present

## 2018-05-24 DIAGNOSIS — L309 Dermatitis, unspecified: Secondary | ICD-10-CM | POA: Diagnosis not present

## 2018-05-24 DIAGNOSIS — Z7689 Persons encountering health services in other specified circumstances: Secondary | ICD-10-CM

## 2018-05-24 DIAGNOSIS — E1165 Type 2 diabetes mellitus with hyperglycemia: Secondary | ICD-10-CM | POA: Diagnosis not present

## 2018-05-24 DIAGNOSIS — M542 Cervicalgia: Secondary | ICD-10-CM | POA: Diagnosis not present

## 2018-05-24 MED ORDER — TERBINAFINE HCL 1 % EX CREA: 1.0000 "application " | TOPICAL_CREAM | Freq: Every day | CUTANEOUS | 0 refills | Status: AC
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
## 2018-05-24 NOTE — Patient Instructions (Signed)
How to Avoid Diabetes Mellitus Problems You can take action to prevent or slow down problems that are caused by diabetes (diabetes mellitus). Following your diabetes plan and taking care of yourself can reduce your risk of serious or life-threatening complications. Manage your diabetes  Follow instructions from your health care providers about managing your diabetes. Your diabetes may be managed by a team of health care providers who can teach you how to care for yourself and can answer questions that you have.  Educate yourself about your condition so you can make healthy choices about eating and physical activity.  Check your blood sugar (glucose) levels as often as directed. Your health care provider will help you decide how often to check your blood glucose level depending on your treatment goals and how well you are meeting them.  Ask your health care provider if you should take low-dose aspirin daily and what dose is recommended for you. Taking low-dose aspirin daily is recommended to help prevent cardiovascular disease. Do not use nicotine or tobacco Do not use any products that contain nicotine or tobacco, such as cigarettes and e-cigarettes. If you need help quitting, ask your health care provider. Nicotine raises your risk for diabetes problems. If you quit using nicotine:  You will lower your risk for heart attack, stroke, nerve disease, and kidney disease.  Your cholesterol and blood pressure may improve.  Your blood circulation will improve.  Keep your blood pressure under control To control your blood pressure:  Follow instructions from your health care provider about meal planning, exercise, and medicines.  Make sure your health care provider checks your blood pressure at every medical visit.  A blood pressure reading consists of two numbers. Generally, the goal is to keep your top number (systolic pressure) at or below 130, and your bottom number (diastolic pressure) at or  below 80. Your health care provider may recommend a lower target blood pressure. Your individualized target blood pressure is determined based on:  Your age.  Your medicines.  How long you have had diabetes.  Any other medical conditions you have.  Keep your cholesterol under control To control your cholesterol:  Follow instructions from your health care provider about meal planning, exercise, and medicines.  Have your cholesterol checked at least once a year.  You may be prescribed medicine to lower cholesterol (statin). If you are not taking a statin, ask your health care provider if you should be.  Controlling your cholesterol may:  Help prevent heart disease and stroke. These are the most common health problems for people with diabetes.  Improve your blood flow.  Schedule and keep yearly physical exams and eye exams Your health care provider will tell you how often you need medical visits depending on your diabetes management plan. Keep all follow-up visits as directed. This is important so possible problems can be identified early and complications can be avoided or treated.  Every visit with your health care provider should include measuring your: ? Weight. ? Blood pressure. ? Blood glucose control.  Your A1c (hemoglobin A1c) level should be checked: ? At least 2 times a year, if you are meeting your treatment goals. ? 4 times a year, if you are not meeting treatment goals or if your treatment goals have changed.  Your blood lipids (lipid profile) should be checked yearly. You should also be checked yearly for protein in your urine (urine microalbumin).  If you have type 1 diabetes, get an eye exam 3-5 years after you  are diagnosed, and then once a year after your first exam.  If you have type 2 diabetes, get an eye exam as soon as you are diagnosed, and then once a year after your first exam.  Keep your vaccines current It is recommended that you receive:  A flu  (influenza) vaccine every year.  A pneumonia (pneumococcal) vaccine and a hepatitis B vaccine. If you are age 6 or older, you may get the pneumonia vaccine as a series of two separate shots.  Ask your health care provider which other vaccines may be recommended. Take care of your feet Diabetes may cause you to have poor blood circulation to your legs and feet. Because of this, taking care of your feet is very important. Diabetes can cause:  The skin on the feet to get thinner, break more easily, and heal more slowly.  Nerve damage in your legs and feet, which results in decreased feeling. You may not notice minor injuries that could lead to serious problems.  To avoid foot problems:  Check your skin and feet every day for cuts, bruises, redness, blisters, or sores.  Schedule a foot exam with your health care provider once every year. This exam includes: ? Inspecting of the structure and skin of your feet. ? Checking the pulses and sensation in your feet.  Make sure that your health care provider performs a visual foot exam at every medical visit.  Take care of your teeth People with poorly controlled diabetes are more likely to have gum (periodontal) disease. Diabetes can make periodontal diseases harder to control. If not treated, periodontal diseases can lead to tooth loss. To prevent this:  Brush your teeth twice a day.  Floss at least once a day.  Visit your dentist 2 times a year.  Drink responsibly Limit alcohol intake to no more than 1 drink a day for nonpregnant women and 2 drinks a day for men. One drink equals 12 oz of beer, 5 oz of wine, or 1 oz of hard liquor. It is important to eat food when you drink alcohol to avoid low blood glucose (hypoglycemia). Avoid alcohol if you:  Have a history of alcohol abuse or dependence.  Are pregnant.  Have liver disease, pancreatitis, advanced neuropathy, or severe hypertriglyceridemia.  Lessen stress Living with diabetes can  be stressful. When you are experiencing stress, your blood glucose may be affected in two ways:  Stress hormones may cause your blood glucose to rise.  You may be distracted from taking good care of yourself.  Be aware of your stress level and make changes to help you manage challenging situations. To lower your stress levels:  Consider joining a support group.  Do planned relaxation or meditation.  Do a hobby that you enjoy.  Maintain healthy relationships.  Exercise regularly.  Work with your health care provider or a mental health professional.  Summary  You can take action to prevent or slow down problems that are caused by diabetes (diabetes mellitus). Following your diabetes plan and taking care of yourself can reduce your risk of serious or life-threatening complications.  Follow instructions from your health care providers about managing your diabetes. Your diabetes may be managed by a team of health care providers who can teach you how to care for yourself and can answer questions that you have.  Your health care provider will tell you how often you need medical visits depending on your diabetes management plan. Keep all follow-up visits as directed. This is important  so possible problems can be identified early and complications can be avoided or treated. °This information is not intended to replace advice given to you by your health care provider. Make sure you discuss any questions you have with your health care provider. °Document Released: 03/28/2011 Document Revised: 04/08/2016 Document Reviewed: 04/08/2016 °Elsevier Interactive Patient Education © 2018 [employer] ° °

## 2018-05-30 ENCOUNTER — Encounter: Payer: Self-pay | Admitting: Family Medicine

## 2018-06-07 DIAGNOSIS — M545 Low back pain: Secondary | ICD-10-CM | POA: Diagnosis not present

## 2018-06-07 DIAGNOSIS — M542 Cervicalgia: Secondary | ICD-10-CM | POA: Diagnosis not present

## 2018-06-07 DIAGNOSIS — G5601 Carpal tunnel syndrome, right upper limb: Secondary | ICD-10-CM | POA: Diagnosis not present

## 2018-06-24 ENCOUNTER — Ambulatory Visit: Payer: 59 | Admitting: Internal Medicine

## 2018-06-24 ENCOUNTER — Encounter: Payer: Self-pay | Admitting: Internal Medicine

## 2018-06-24 VITALS — BP 122/80 | HR 88 | Temp 98.2°F | Wt 225.3 lb

## 2018-06-24 DIAGNOSIS — E1165 Type 2 diabetes mellitus with hyperglycemia: Secondary | ICD-10-CM

## 2018-06-24 DIAGNOSIS — Z9109 Other allergy status, other than to drugs and biological substances: Secondary | ICD-10-CM

## 2018-06-24 DIAGNOSIS — Z794 Long term (current) use of insulin: Secondary | ICD-10-CM

## 2018-06-24 DIAGNOSIS — R05 Cough: Secondary | ICD-10-CM

## 2018-06-24 DIAGNOSIS — H60391 Other infective otitis externa, right ear: Secondary | ICD-10-CM | POA: Diagnosis not present

## 2018-06-24 DIAGNOSIS — R059 Cough, unspecified: Secondary | ICD-10-CM

## 2018-06-24 DIAGNOSIS — J069 Acute upper respiratory infection, unspecified: Secondary | ICD-10-CM

## 2018-06-24 MED ORDER — DOXYCYCLINE HYCLATE 100 MG PO TABS: 100.0000 mg | ORAL_TABLET | Freq: Two times a day (BID) | ORAL | 0 refills | Status: DC

## 2018-06-24 NOTE — Progress Notes (Signed)
I
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
## 2018-06-24 NOTE — Patient Instructions (Signed)
Treating for slin infection around the ear .  probabaly started with as skin dermatitis  .   Continue warm compresses  As  You are doing and no earroings  Chest exam is normal   Saline nose drops spray to keep moist and help sinuses .   Antibiotic can help if there are bacterial sinus infection.   Plan FU with dr Volanda Napoleon in 2 weeks if not all better  Or  If worse as needed

## 2018-07-18 DIAGNOSIS — Z794 Long term (current) use of insulin: Secondary | ICD-10-CM | POA: Diagnosis not present

## 2018-07-18 DIAGNOSIS — Z833 Family history of diabetes mellitus: Secondary | ICD-10-CM | POA: Diagnosis not present

## 2018-07-18 DIAGNOSIS — E109 Type 1 diabetes mellitus without complications: Secondary | ICD-10-CM | POA: Diagnosis not present

## 2018-08-16 ENCOUNTER — Ambulatory Visit: Payer: 59 | Admitting: Internal Medicine

## 2018-08-16 ENCOUNTER — Encounter: Payer: Self-pay | Admitting: Internal Medicine

## 2018-08-16 VITALS — BP 110/70 | HR 91 | Temp 98.1°F | Wt 228.1 lb

## 2018-08-16 DIAGNOSIS — H60541 Acute eczematoid otitis externa, right ear: Secondary | ICD-10-CM

## 2018-08-16 DIAGNOSIS — N3001 Acute cystitis with hematuria: Secondary | ICD-10-CM | POA: Diagnosis not present

## 2018-08-16 DIAGNOSIS — R300 Dysuria: Secondary | ICD-10-CM | POA: Diagnosis not present

## 2018-08-16 LAB — POCT URINALYSIS DIPSTICK
Bilirubin, UA: NEGATIVE
Glucose, UA: POSITIVE — AB
Ketones, UA: NEGATIVE
Nitrite, UA: POSITIVE
PH UA: 6 (ref 5.0–8.0)
Protein, UA: POSITIVE — AB
RBC UA: POSITIVE
SPEC GRAV UA: 1.015 (ref 1.010–1.025)
UROBILINOGEN UA: 0.2 U/dL

## 2018-08-16 MED ORDER — SULFAMETHOXAZOLE-TRIMETHOPRIM 800-160 MG PO TABS: 1.0000 | ORAL_TABLET | Freq: Two times a day (BID) | ORAL | 0 refills | Status: AC

## 2018-08-16 MED ORDER — TRIAMCINOLONE ACETONIDE 0.1 % EX CREA: 1.0000 "application " | TOPICAL_CREAM | Freq: Two times a day (BID) | CUTANEOUS | 0 refills | Status: AC

## 2018-08-16 NOTE — Addendum Note (Signed)
Addended by: Gwynne Edinger on: 08/16/2018 05:29 PM   Modules accepted: Orders

## 2018-08-16 NOTE — Addendum Note (Signed)
Addended by: Gwynne Edinger on: 08/16/2018 05:30 PM   Modules accepted: Orders

## 2018-08-16 NOTE — Progress Notes (Signed)
Acute Office Visit     CC/Reason for Visit: dysuria, rash behind right ear  HPI: Darlene Watts' Ortlieb is a 28 y.o. female who is coming in today for the above mentioned reasons.  She is here today for an acute visit with 2 complaints:  1.  For the past 2 days she has been having dysuria, urinary frequency, chills she has felt feverish although she has not actually taken her temperature.  She thinks she has a UTI.  2.  She has a rash behind her right ear that she would like me to look at.   Past Medical/Surgical History: Past Medical History:  Diagnosis Date  . Diabetes mellitus    Type 2 - Novolog 70/30  . Herpes simplex type 2 infection March 2013  . Ketoacidosis   . PCOS (polycystic ovarian syndrome)   . Trichimoniasis   . Urinary tract infection     Past Surgical History:  Procedure Laterality Date  . NO PAST SURGERIES      Social History:  reports that she has never smoked. She has never used smokeless tobacco. She reports that she does not drink alcohol or use drugs.  Allergies: Allergies  Allergen Reactions  . Amoxicillin Hives    Has patient had a PCN reaction causing immediate rash, facial/tongue/throat swelling, SOB or lightheadedness with hypotension: Yes Has patient had a PCN reaction causing severe rash involving mucus membranes or skin necrosis: No Has patient had a PCN reaction that required hospitalization: No Has patient had a PCN reaction occurring within the last 10 years: Yes If all of the above answers are "NO", then may proceed with Cephalosporin use.   Jimmy Footman [Exenatide] Diarrhea  . Macrobid [Nitrofurantoin] Diarrhea    Family History:  Family History  Problem Relation Age of Onset  . Diabetes Mother   . Hypertension Mother   . Stroke Mother   . Heart attack Father   . Hearing loss Father   . COPD Father   . Arthritis Father      Current Outpatient Medications:  .  CYCLOBENZAPRINE HCL PO, Take 10 mg by mouth 2 (two) times  daily., Disp: , Rfl:  .  insulin detemir (LEVEMIR) 100 UNIT/ML injection, Inject 60 Units into the skin at bedtime., Disp: , Rfl:  .  insulin lispro (HUMALOG) 100 UNIT/ML injection, Inject 10 Units into the skin 3 (three) times daily with meals., Disp: , Rfl:  .  loratadine (CLARITIN) 10 MG tablet, Take 10 mg by mouth daily as needed for allergies. OTC, Disp: , Rfl:  .  sulfamethoxazole-trimethoprim (BACTRIM DS,SEPTRA DS) 800-160 MG tablet, Take 1 tablet by mouth 2 (two) times daily for 7 days., Disp: 14 tablet, Rfl: 0 .  triamcinolone cream (KENALOG) 0.1 %, Apply 1 application topically 2 (two) times daily., Disp: 30 g, Rfl: 0  Review of Systems:  Constitutional: Denies fever, chills, diaphoresis, appetite change and fatigue.  HEENT: Denies photophobia, eye pain, redness, hearing loss, ear pain, congestion, sore throat, rhinorrhea, sneezing, mouth sores, trouble swallowing, neck pain, neck stiffness and tinnitus.   Respiratory: Denies SOB, DOE, cough, chest tightness,  and wheezing.   Cardiovascular: Denies chest pain, palpitations and leg swelling.  Gastrointestinal: Denies nausea, vomiting, abdominal pain, diarrhea, constipation, blood in stool and abdominal distention.  Genitourinary: Denies dysuria, urgency, frequency, hematuria, flank pain and difficulty urinating.  Endocrine: Denies: hot or cold intolerance, sweats, changes in hair or nails, polyuria, polydipsia. Musculoskeletal: Denies myalgias, back pain, joint swelling, arthralgias and  gait problem.  Skin: Denies pallor, rash and wound.  Neurological: Denies dizziness, seizures, syncope, weakness, light-headedness, numbness and headaches.  Hematological: Denies adenopathy. Easy bruising, personal or family bleeding history  Psychiatric/Behavioral: Denies suicidal ideation, mood changes, confusion, nervousness, sleep disturbance and agitation    Physical Exam: Vitals:   08/16/18 1611  BP: 110/70  Pulse: 91  Temp: 98.1 F (36.7  C)  TempSrc: Oral  SpO2: 98%  Weight: 228 lb 1.6 oz (103.5 kg)    Body mass index is 41.72 kg/m.   Constitutional: NAD, calm, comfortable Eyes: PERRL, lids and conjunctivae normal ENMT: Mucous membranes are moist.  Behind right ear there is a wet looking disruption of skin with erythema Neck: normal, supple, no masses, no thyromegaly Respiratory: clear to auscultation bilaterally, no wheezing, no crackles. Normal respiratory effort. No accessory muscle use.  Cardiovascular: Regular rate and rhythm, no murmurs / rubs / gallops. No extremity edema. 2+ pedal pulses. No carotid bruits.  Abdomen: no tenderness, no masses palpated. No hepatosplenomegaly. Bowel sounds positive.  Musculoskeletal: no clubbing / cyanosis. No joint deformity upper and lower extremities. Good ROM, no contractures. Normal muscle tone.  Skin: no rashes, lesions, ulcers. No induration Neurologic: CN 2-12 grossly intact. Sensation intact, DTR normal. Strength 5/5 in all 4.  Psychiatric: Normal judgment and insight. Alert and oriented x 3. Normal mood.    Impression and Plan:   Acute cystitis with hematuria -Urine dipstick today strongly positive for UTI with positive blood, positive nitrates and positive leukocytes. -Urine will be sent for UA and culture. -Will treat with Bactrim double strength twice daily for 7 days.  Eczema of external ear, right  -Triamcinolone cream twice daily for up to 5 days.  Advised to return to clinic if no improvement in symptoms.    Patient Instructions  -It was nice seeing you today!  -Start taking Bactrim DS 1 tablet twice daily for your UTI for 7 days.  -Triamcinolone cream to behind right ear twice a day for 5 days.     Lelon Frohlich, MD [facility]

## 2018-08-16 NOTE — Addendum Note (Signed)
Addended by: Gwynne Edinger on: 08/16/2018 05:31 PM   Modules accepted: Orders

## 2018-08-16 NOTE — Patient Instructions (Signed)
-  It was nice seeing you today!  -Start taking Bactrim DS 1 tablet twice daily for your UTI for 7 days.  -Triamcinolone cream to behind right ear twice a day for 5 days.

## 2018-08-17 LAB — URINALYSIS
BILIRUBIN URINE: NEGATIVE
Nitrite: POSITIVE — AB
Specific Gravity, Urine: 1.022 (ref 1.001–1.03)
pH: 6 (ref 5.0–8.0)

## 2018-08-18 LAB — URINE CULTURE
MICRO NUMBER:: 101610
SPECIMEN QUALITY:: ADEQUATE

## 2018-08-21 ENCOUNTER — Other Ambulatory Visit: Payer: Self-pay | Admitting: Internal Medicine

## 2018-08-21 DIAGNOSIS — N3001 Acute cystitis with hematuria: Secondary | ICD-10-CM

## 2018-08-21 MED ORDER — CIPROFLOXACIN HCL 500 MG PO TABS: 500.0000 mg | ORAL_TABLET | Freq: Two times a day (BID) | ORAL | 0 refills | Status: DC

## 2018-08-26 ENCOUNTER — Telehealth: Payer: Self-pay | Admitting: Family Medicine

## 2018-08-26 ENCOUNTER — Other Ambulatory Visit: Payer: Self-pay | Admitting: *Deleted

## 2018-08-26 DIAGNOSIS — N3001 Acute cystitis with hematuria: Secondary | ICD-10-CM

## 2018-08-26 MED ORDER — CIPROFLOXACIN HCL 500 MG PO TABS: 500.0000 mg | ORAL_TABLET | Freq: Two times a day (BID) | ORAL | 0 refills | Status: AC

## 2018-08-26 NOTE — Telephone Encounter (Signed)
This message has been addressed.

## 2018-08-26 NOTE — Telephone Encounter (Signed)
Copied from [place] [id number]. Topic: Quick Communication - Rx Refill/Question >> Aug 26, 2018  9:26 AM Rayann Heman wrote: Medication: ciprofloxacin (CIPRO) 500 MG tablet [[id number]]  sent to wrong pharmacy. Please advise   Preferred Pharmacy (with phone number or street name):[id number] - [place], [place] [phone] (Phone) [phone] (Fax)   Agent: Please be advised that RX refills may take up to 3 business days. We ask that you follow-up with your pharmacy.

## 2018-09-10 ENCOUNTER — Encounter: Payer: Self-pay | Admitting: Family Medicine

## 2018-09-10 ENCOUNTER — Ambulatory Visit (INDEPENDENT_AMBULATORY_CARE_PROVIDER_SITE_OTHER): Payer: 59 | Admitting: Family Medicine

## 2018-09-10 VITALS — BP 102/70 | HR 84 | Temp 98.2°F | Wt 224.0 lb

## 2018-09-10 DIAGNOSIS — R4589 Other symptoms and signs involving emotional state: Secondary | ICD-10-CM

## 2018-09-10 DIAGNOSIS — R5383 Other fatigue: Secondary | ICD-10-CM | POA: Diagnosis not present

## 2018-09-10 DIAGNOSIS — Z0000 Encounter for general adult medical examination without abnormal findings: Secondary | ICD-10-CM

## 2018-09-10 DIAGNOSIS — Z13220 Encounter for screening for lipoid disorders: Secondary | ICD-10-CM | POA: Diagnosis not present

## 2018-09-10 DIAGNOSIS — E1165 Type 2 diabetes mellitus with hyperglycemia: Secondary | ICD-10-CM

## 2018-09-10 DIAGNOSIS — F329 Major depressive disorder, single episode, unspecified: Secondary | ICD-10-CM

## 2018-09-10 LAB — LIPID PANEL
Cholesterol: 133 mg/dL (ref 0–200)
HDL: 36.5 mg/dL — AB (ref >39.00)
LDL Cholesterol: 83 mg/dL (ref 0–99)
NONHDL: 96.99
Total CHOL/HDL Ratio: 4
Triglycerides: 68 mg/dL (ref 0.0–149.0)
VLDL: 13.6 mg/dL (ref 0.0–40.0)

## 2018-09-10 LAB — BASIC METABOLIC PANEL
BUN: 12 mg/dL (ref 6–23)
CALCIUM: 9.3 mg/dL (ref 8.4–10.5)
CHLORIDE: 104 meq/L (ref 96–112)
CO2: 26 mEq/L (ref 19–32)
CREATININE: 0.77 mg/dL (ref 0.40–1.20)
GFR: 109.7 mL/min (ref >60.00)
Glucose, Bld: 215 mg/dL — ABNORMAL HIGH (ref 70–99)
Potassium: 4.2 mEq/L (ref 3.5–5.1)
SODIUM: 138 meq/L (ref 135–145)

## 2018-09-10 LAB — VITAMIN D 25 HYDROXY (VIT D DEFICIENCY, FRACTURES): VITD: 9.99 ng/mL — AB (ref 30.00–100.00)

## 2018-09-10 LAB — CBC WITH DIFFERENTIAL/PLATELET
BASOS ABS: 0 10*3/uL (ref 0.0–0.1)
Basophils Relative: 0.2 % (ref 0.0–3.0)
Eosinophils Absolute: 0 10*3/uL (ref 0.0–0.7)
Eosinophils Relative: 0.4 % (ref 0.0–5.0)
HCT: 36.9 % (ref 36.0–46.0)
Hemoglobin: 12.1 g/dL (ref 12.0–15.0)
LYMPHS ABS: 2.4 10*3/uL (ref 0.7–4.0)
Lymphocytes Relative: 31.6 % (ref 12.0–46.0)
MCHC: 32.9 g/dL (ref 30.0–36.0)
MCV: 79.9 fl (ref 78.0–100.0)
MONO ABS: 0.4 10*3/uL (ref 0.1–1.0)
MONOS PCT: 5.1 % (ref 3.0–12.0)
NEUTROS ABS: 4.8 10*3/uL (ref 1.4–7.7)
NEUTROS PCT: 62.7 % (ref 43.0–77.0)
Platelets: 243 10*3/uL (ref 150.0–400.0)
RBC: 4.61 Mil/uL (ref 3.87–5.11)
RDW: 13.6 % (ref 11.5–15.5)
WBC: 7.7 10*3/uL (ref 4.0–10.5)

## 2018-09-10 LAB — T4, FREE: FREE T4: 0.84 ng/dL (ref 0.60–1.60)

## 2018-09-10 LAB — VITAMIN B12: Vitamin B-12: 335 pg/mL (ref 211–911)

## 2018-09-10 LAB — TSH: TSH: 0.96 u[IU]/mL (ref 0.35–4.50)

## 2018-09-10 NOTE — Progress Notes (Signed)
Subjective:     Darlene Watts is a 28 y.o. female and is here for a comprehensive physical exam. She is accompanied by her husband.  The patient reports problems - elevated blood sugars.  FSBS after taking Bactrim for UTI.  Pt was concerned she was in DKA but did not want to go to the hospital.  Pt has contacted her endocrinologist.  Using at home urine ketone strips.  Denies n/v, weakness, dizziness, increased thirst, urination, or hunger.  Pt does mention seeing a Psychologist.  Pt has started [place] to help with her mood.  Pt states Psychologist suggested starting medication for depression but pt wants to wait.  States she was advised her mood is slightly decreased from normal.  Pt endorses ok sleep, decreased energy, and ok mood, but just assumed it was from her diabetes.  Social History   Socioeconomic History  . Marital status: Single    Spouse name: Not on file  . Number of children: Not on file  . Years of education: Not on file  . Highest education level: Not on file  Occupational History  . Not on file  Social Needs  . Financial resource strain: Not on file  . Food insecurity:    Worry: Not on file    Inability: Not on file  . Transportation needs:    Medical: Not on file    Non-medical: Not on file  Tobacco Use  . Smoking status: Never Smoker  . Smokeless tobacco: Never Used  Substance and Sexual Activity  . Alcohol use: No  . Drug use: No  . Sexual activity: Yes    Birth control/protection: None  Lifestyle  . Physical activity:    Days per week: Not on file    Minutes per session: Not on file  . Stress: Not on file  Relationships  . Social connections:    Talks on phone: Not on file    Gets together: Not on file    Attends religious service: Not on file    Active member of club or organization: Not on file    Attends meetings of clubs or organizations: Not on file    Relationship status: Not on file  . Intimate partner violence:    Fear of  current or ex partner: Not on file    Emotionally abused: Not on file    Physically abused: Not on file    Forced sexual activity: Not on file  Other Topics Concern  . Not on file  Social History Narrative  . Not on file   Health Maintenance  Topic Date Due  . PNEUMOCOCCAL POLYSACCHARIDE VACCINE AGE 58-64 HIGH RISK  10/05/1994  . FOOT EXAM  10/05/2002  . OPHTHALMOLOGY EXAM  10/05/2002  . TETANUS/TDAP  10/05/2011  . PAP-Cervical Cytology Screening  10/04/2013  . URINE MICROALBUMIN  10/30/2015  . HEMOGLOBIN A1C  09/09/2017  . INFLUENZA VACCINE  10/23/2018 (Originally 02/21/2018)  . PAP SMEAR-Modifier  09/20/2020  . HIV Screening  Completed    The following portions of the patient's history were reviewed and updated as appropriate: allergies, current medications, past family history, past medical history, past social history, past surgical history and problem list.  Review of Systems Pertinent items noted in HPI and remainder of comprehensive ROS otherwise negative.   Objective:    BP 102/70 (BP Location: Left Arm, Patient Position: Sitting, Cuff Size: Large)   Pulse 84   Temp 98.2 F (36.8 C) (Oral)   Wt 224  lb (101.6 kg)   LMP 08/31/2018 (Exact Date)   SpO2 98%   BMI 40.97 kg/m  General appearance: alert, cooperative and no distress Head: Normocephalic, without obvious abnormality, atraumatic Eyes: conjunctivae/corneas clear. PERRL, EOM's intact. Fundi benign. Ears: normal TM's and external ear canals both ears Nose: Nares normal. Septum midline. Mucosa normal. No drainage or sinus tenderness. Throat: lips, mucosa, and tongue normal; teeth and gums normal Neck: no adenopathy, no carotid bruit, no JVD, supple, symmetrical, trachea midline and thyroid not enlarged, symmetric, no tenderness/mass/nodules Lungs: clear to auscultation bilaterally Heart: regular rate and rhythm, S1, S2 normal, no murmur, click, rub or gallop Abdomen: soft, non-tender; bowel sounds normal; no  masses,  no organomegaly Extremities: extremities normal, atraumatic, no cyanosis or edema Skin: Skin color, texture, turgor normal. No rashes or lesions Lymph nodes: Cervical, supraclavicular, and axillary nodes normal. Neurologic: Alert and oriented X 3, normal strength and tone. Normal symmetric reflexes. Normal coordination and gait    Assessment:    Healthy female exam.      Plan:     Anticipatory guidance given including wearing seatbelts, smoke detectors in the home, increasing physical activity, increasing p.o. intake of water and vegetables. -will obtain labs -pap up to date. -given hanoduts -next CPE in 1 yr See After Visit Summary for Counseling Recommendations    Fatigue -will obtain TSH, free T4, vitamins D and B12  Uncontrolled type 2 diabetes with hyperglycemia -We will obtain hemoglobin A1c -Continue checking FS BS at home and adjusting Humalog as needed -Patient encouraged to follow-up with her endocrinologist  Depressed mood -PHQ 9 score 10 -Gad 7 score 1 -We will obtain TSH, free T4 -Continue following with psychologist -Continue [place]. John's wort for now  Follow-up in the next few weeks as needed  Grier Mitts, MD

## 2018-09-10 NOTE — Patient Instructions (Signed)
Preventive Care 18-39 Years, Female Preventive care refers to lifestyle choices and visits with your health care provider that can promote health and wellness. What does preventive care include?   A yearly physical exam. This is also called an annual well check.  Dental exams once or twice a year.  Routine eye exams. Ask your health care provider how often you should have your eyes checked.  Personal lifestyle choices, including: ? Daily care of your teeth and gums. ? Regular physical activity. ? Eating a healthy diet. ? Avoiding tobacco and drug use. ? Limiting alcohol use. ? Practicing safe sex. ? Taking vitamin and mineral supplements as recommended by your health care provider. What happens during an annual well check? The services and screenings done by your health care provider during your annual well check will depend on your age, overall health, lifestyle risk factors, and family history of disease. Counseling Your health care provider may ask you questions about your:  Alcohol use.  Tobacco use.  Drug use.  Emotional well-being.  Home and relationship well-being.  Sexual activity.  Eating habits.  Work and work environment.  Method of birth control.  Menstrual cycle.  Pregnancy history. Screening You may have the following tests or measurements:  Height, weight, and BMI.  Diabetes screening. This is done by checking your blood sugar (glucose) after you have not eaten for a while (fasting).  Blood pressure.  Lipid and cholesterol levels. These may be checked every 5 years starting at age 20.  Skin check.  Hepatitis C blood test.  Hepatitis B blood test.  Sexually transmitted disease (STD) testing.  BRCA-related cancer screening. This may be done if you have a family history of breast, ovarian, tubal, or peritoneal cancers.  Pelvic exam and Pap test. This may be done every 3 years starting at age 21. Starting at age 30, this may be done every 5  years if you have a Pap test in combination with an HPV test. Discuss your test results, treatment options, and if necessary, the need for more tests with your health care provider. Vaccines Your health care provider may recommend certain vaccines, such as:  Influenza vaccine. This is recommended every year.  Tetanus, diphtheria, and acellular pertussis (Tdap, Td) vaccine. You may need a Td booster every 10 years.  Varicella vaccine. You may need this if you have not been vaccinated.  HPV vaccine. If you are 26 or younger, you may need three doses over 6 months.  Measles, mumps, and rubella (MMR) vaccine. You may need at least one dose of MMR. You may also need a second dose.  Pneumococcal 13-valent conjugate (PCV13) vaccine. You may need this if you have certain conditions and were not previously vaccinated.  Pneumococcal polysaccharide (PPSV23) vaccine. You may need one or two doses if you smoke cigarettes or if you have certain conditions.  Meningococcal vaccine. One dose is recommended if you are age 19-21 years and a first-year college student living in a residence hall, or if you have one of several medical conditions. You may also need additional booster doses.  Hepatitis A vaccine. You may need this if you have certain conditions or if you travel or work in places where you may be exposed to hepatitis A.  Hepatitis B vaccine. You may need this if you have certain conditions or if you travel or work in places where you may be exposed to hepatitis B.  Haemophilus influenzae type b (Hib) vaccine. You may need this if you   have certain risk factors. Talk to your health care provider about which screenings and vaccines you need and how often you need them. This information is not intended to replace advice given to you by your health care provider. Make sure you discuss any questions you have with your health care provider. Document Released: 09/05/2001 Document Revised: 02/20/2017  Document Reviewed: 05/11/2015 Elsevier Interactive Patient Education  2019 [employer].  Fatigue If you have fatigue, you feel tired all the time and have a lack of energy or a lack of motivation. Fatigue may make it difficult to start or complete tasks because of exhaustion. In general, occasional or mild fatigue is often a normal response to activity or life. However, long-lasting (chronic) or extreme fatigue may be a symptom of a medical condition. Follow these instructions at home: General instructions  Watch your fatigue for any changes.  Go to bed and get up at the same time every day.  Avoid fatigue by pacing yourself during the day and getting enough sleep at night.  Maintain a healthy weight. Medicines  Take over-the-counter and prescription medicines only as told by your health care provider.  Take a multivitamin, if told by your health care provider.  Do not use herbal or dietary supplements unless they are approved by your health care provider. Activity   Exercise regularly, as told by your health care provider.  Use or practice techniques to help you relax, such as yoga, tai chi, meditation, or massage therapy. Eating and drinking   Avoid heavy meals in the evening.  Eat a well-balanced diet, which includes lean proteins, whole grains, plenty of fruits and vegetables, and low-fat dairy products.  Avoid consuming too much caffeine.  Avoid the use of alcohol.  Drink enough fluid to keep your urine pale yellow. Lifestyle  Change situations that cause you stress. Try to keep your work and personal schedule in balance.  Do not use any products that contain nicotine or tobacco, such as cigarettes and e-cigarettes. If you need help quitting, ask your health care provider.  Do not use drugs. Contact a health care provider if:  Your fatigue does not get better.  You have a fever.  You suddenly lose or gain weight.  You have headaches.  You have trouble  falling asleep or sleeping through the night.  You feel angry, guilty, anxious, or sad.  You are unable to have a bowel movement (constipation).  Your skin is dry.  You have swelling in your legs or another part of your body. Get help right away if:  You feel confused.  Your vision is blurry.  You feel faint or you pass out.  You have a severe headache.  You have severe pain in your abdomen, your back, or the area between your waist and hips (pelvis).  You have chest pain, shortness of breath, or an irregular or fast heartbeat.  You are unable to urinate, or you urinate less than normal.  You have abnormal bleeding, such as bleeding from the rectum, vagina, nose, lungs, or nipples.  You vomit blood.  You have thoughts about hurting yourself or others. If you ever feel like you may hurt yourself or others, or have thoughts about taking your own life, get help right away. You can go to your nearest emergency department or call:  Your local emergency services (911 in the [place]  A suicide crisis helpline, such as the [place] at 5181318167. This is open 24 hours a day. Summary  If you have fatigue, you feel tired all the time and have a lack of energy or a lack of motivation.  Fatigue may make it difficult to start or complete tasks because of exhaustion.  Long-lasting (chronic) or extreme fatigue may be a symptom of a medical condition.  Exercise regularly, as told by your health care provider.  Change situations that cause you stress. Try to keep your work and personal schedule in balance. This information is not intended to replace advice given to you by your health care provider. Make sure you discuss any questions you have with your health care provider. Document Released: 05/07/2007 Document Revised: 04/04/2017 Document Reviewed: 04/04/2017 Elsevier Interactive Patient Education  2019 [employer]  Vegetarian Eating  Information Many people may prefer vegetarian eating for religious, environmental, or personal reasons. These diets are often lower in calories, salt, sugar, cholesterol, and saturated and trans fats. Vegetarian eating provides significant health benefits. People who eat a vegetarian diet often have lower rates of:  Obesity.  Diabetes.  Breast and colon cancers.  Cardiovascular and gallbladder diseases. What are the types of vegetarian eating?  Vegetarian eating includes dietary choices that focus on eating mostly vegetables and fruit, grains, beans, nuts, and seeds. There are several different types of vegetarian eating. Talk with a diet and nutrition specialist (dietitian) about what type of vegetarian diet is best for you. Lacto-ovo vegetarian  Recommended foods: fruits and vegetables, milk and dairy, eggs, grains, soy and vegetable protein, beans, nuts, and seeds.  Foods to avoid: meat, poultry, seafood, animal-based broths and gravies, and gelatin. Lacto-vegetarian  Recommended foods: fruits and vegetables, milk and dairy, grains, soy and vegetable protein, beans, nuts, and seeds.  Foods to avoid: meat, poultry, seafood, animal-based broths and gravies, gelatin, and eggs. Vegan  Recommended foods: fruits and vegetables, grains, soy and vegetable protein, beans, nuts, and seeds.  Foods to avoid: meat, poultry, seafood, animal-based broths and gravies, gelatin, eggs, milk and dairy, and honey. What do I need to know about vegetarian eating? All vegetarian diets restrict proteins that come from animals. Foods that come from animals have important nutrients, such as protein, fats, vitamins, and minerals. It is important to get these nutrients from other types of foods. If you think you may not be getting the right nutrients, or if you do not eat any animal products, talk with your health care provider or dietitian about taking supplements. A dietitian can help determine your individual  nutrient needs. What are tips for following this plan? Eat a diet that includes a variety of fruits, vegetables, whole grains, and protein sources. This is important to make sure you get enough of the following nutrients: Protein Healthy protein sources include:  Eggs, milk, and cheese. Soy products. Tofu, tempeh, and textured vegetable protein (TVP). Quinoa. Hemp seeds. Other protein sources include:  Beans, such as black beans or kidney beans. Other legumes, such as lentils and split peas. Nuts, such as almonds, [place] nuts, and pecans. Seeds, such as sunflower seeds. To get the most benefit from plant-based proteins, combine two or more sources of plant protein with whole grains in one dish. Examples include beans and rice, almond butter on bread, or sunflower seeds on noodles. Vitamin B12 Sources of vitamin B12 include:  Cheese and eggs. Breakfast cereals and other prepared products that have vitamin B12 added (fortified products). If you eat a vegan diet, ask your health care provider or dietitian about taking a B12 supplement. Vitamin D Good sources of vitamin D   include:  Egg yolks. Fortified dairy products. Fortified orange juice. Mushrooms. Cereals with added vitamin D. Another way of getting vitamin D is to spend 10 minutes each day in the sun. This helps your body make its own vitamin D. Depending on your age, you may need to take a vitamin D supplement. Talk with your health care provider or dietitian about how much vitamin D you need in a supplement. Iron Healthy sources of iron include:  Dark, leafy greens. Nuts. Beans. Grain products that are fortified with iron, such as cereals. Tofu, tempeh, soybeans, and quinoa. To get the most iron from plant-based foods:  Eat iron-containing plant-based foods with vitamin C. For example, squeeze fresh lemon juice over cooked greens like kale, chard, or spinach, or have a glass of orange juice with your meals.  Avoid eating dairy  products, coffee, or tea with iron-containing foods. Omega-3 fatty acids Good sources of omega-3 fatty acids include:  Walnuts. Flax seeds, canola oil, soybean oil, and tofu. Avocados. Olives and olive oil. Foods with added omega-3 fatty acids, such as eggs, milk, and juices. Calcium Good sources of calcium include:  Dairy products. Fortified non-dairy milk. Fortified tofu. Dark, leafy greens, such as kale, bok choy, Chinese cabbage, collard greens and mustard greens. Broccoli. Okra. Fortified breakfast cereals and fruit juices. Figs. Zinc Good sources of zinc include:  Pumpkin seeds. Legumes, such as chickpeas, kidney beans, and green peas. Wheat germ, whole grains, and fortified cereals. Mushrooms. Spinach and kale. Milk and dairy foods. Dark chocolate. Summary  Vegetarian eating is a choice made by people who prefer vegetarian eating for religious, environmental, or personal reasons. These diets can provide significant health benefits.  There are several types of vegetarian diets, but all restrict proteins that come from animals.  It is important to make sure that you are getting enough nutrients, including protein, vitamin B12, vitamin D, iron, omega-3 fatty acids, calcium, and zinc from your diet.  If you think you are not getting the right nutrients or if you do not eat any animal products, talk with your health care provider or dietitian. This information is not intended to replace advice given to you by your health care provider. Make sure you discuss any questions you have with your health care provider. Document Released: 07/13/2003 Document Revised: 09/12/2016 Document Reviewed: 09/12/2016 Elsevier Interactive Patient Education  2019 [employer].

## 2018-09-13 MED ORDER — VITAMIN D (ERGOCALCIFEROL) 1.25 MG (50000 UNIT) PO CAPS: 50000.0000 [IU] | ORAL_CAPSULE | ORAL | 0 refills | Status: AC

## 2018-09-15 ENCOUNTER — Encounter: Payer: Self-pay | Admitting: Family Medicine

## 2018-10-16 ENCOUNTER — Telehealth: Payer: Self-pay | Admitting: Family Medicine

## 2018-10-16 NOTE — Telephone Encounter (Signed)
A letter has been faxed to pt office, pt is aware that there is a $29 fee for any office letter provided regarding COVID-19.

## 2018-10-16 NOTE — Telephone Encounter (Signed)
Incoming call from Patient inquiring about Sx.  Of Covid-19.  Reviewed [facility]  Exposure protocol with Patient.  Patient states  It feels like sinus  infection.  Denies temperature.   Recommend patient  Self quarantine  Monitor Sx.  And treat Sx.  Related that if  Sx.  Worsen to call us back or go to ED.  Patient  Voices understanding.  Patient request a note for work.  Related to Patient that I will forward the TE to office and the office will contact Patient.  Best number to contact Patient is [phone]

## 2018-10-16 NOTE — Telephone Encounter (Signed)
   Answer Assessment - Initial Assessment Questions 1. CONFIRMED CASE: "Who is the person with the confirmed COVID-19 infection that you were exposed to?"     Not sure 2. PLACE of CONTACT: "Where were you when you were exposed to COVID-19  (coronavirus disease 2019)?" (e.g., city, state, country)     *No Answer* 3. TYPE of CONTACT: "How much contact was there?" (e.g., live in same house, work in same office, same school)     *No Answer* 4. DATE of CONTACT: "When did you have contact with a coronavirus patient?" (e.g., days)     *No Answer* 5. DURATION of CONTACT: "How long were you in contact with the COVID-19 (coronavirus disease) patient?" (e.g., a few seconds, passed by person, a few minutes, live with the patient)     *No Answer* 6. SYMPTOMS: "Do you have any symptoms?" (e.g., fever, cough, breathing difficulty)     *No Answer* 7. PREGNANCY OR POSTPARTUM: "Is there any chance you are pregnant?" "When was your last menstrual period?" "Did you deliver in the last 2 weeks?"     *No Answer* 8. HIGH RISK: "Do you have any heart or lung problems? Do you have a weakened immune system?" (e.g., CHF, COPD, asthma, HIV positive, chemotherapy, renal failure, diabetes mellitus, sickle cell anemia)     *No Answer*  Protocols used: CORONAVIRUS (COVID-19) EXPOSURE-A-AH   This encounter was created in error - please disregard.

## 2018-10-25 ENCOUNTER — Ambulatory Visit: Payer: 59 | Admitting: Family Medicine

## 2018-11-15 IMAGING — CT CT RENAL STONE PROTOCOL
2 of 4 series · 17 of 46 positions shown, 19 images · non-contrast
Comparison: None.

CLINICAL DATA: Left flank pain

EXAM:
CT ABDOMEN AND PELVIS WITHOUT CONTRAST
TECHNIQUE: Multidetector CT imaging of the abdomen and pelvis was performed
following the standard protocol without IV contrast.

[Series 3: renal stone 5mm · axial · 0.80mm/px · z∈[-1115,-675]mm · 14 of 97 slices shown, 16 images]
[im 5/97  soft-tissue]
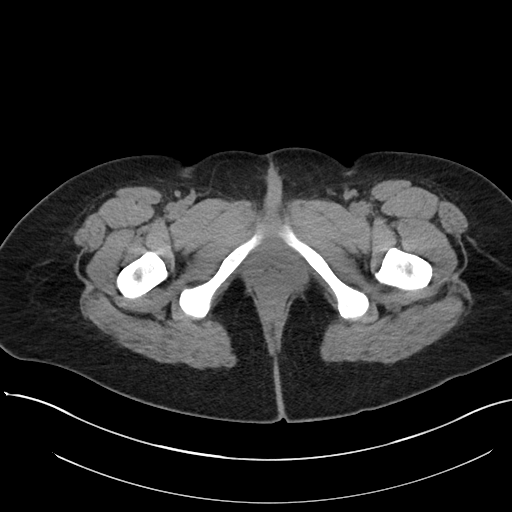
[im 5/97  bone]
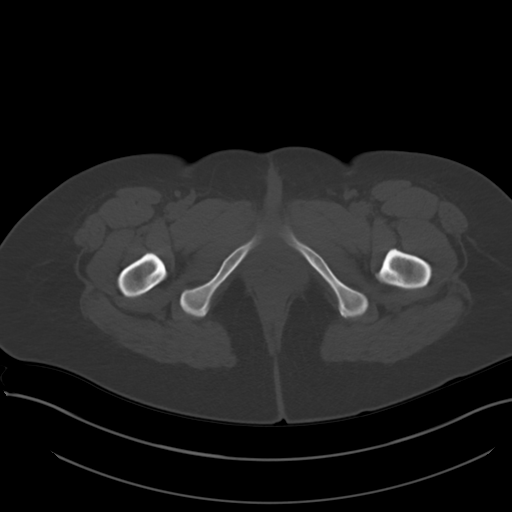
[im 13/97  soft-tissue]
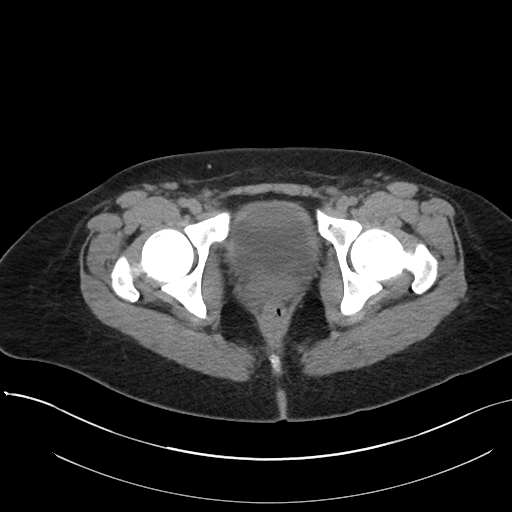
[im 21/97  soft-tissue]
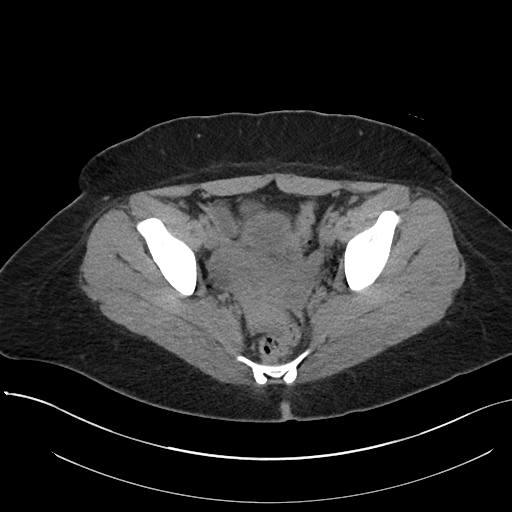
[im 25/97  soft-tissue]
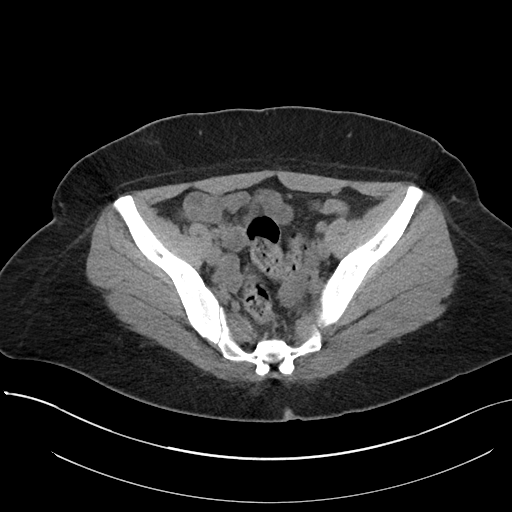
[im 33/97  soft-tissue]
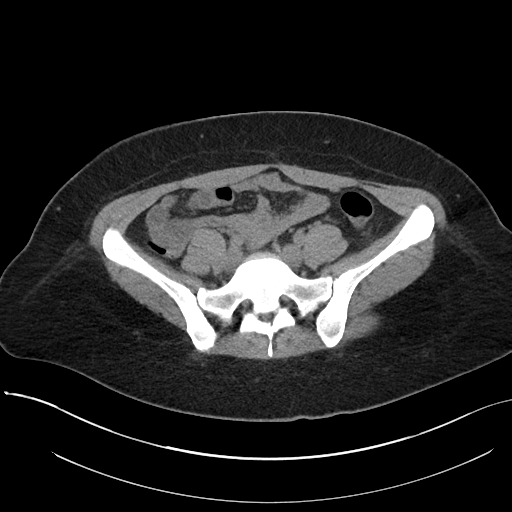
[im 41/97  soft-tissue]
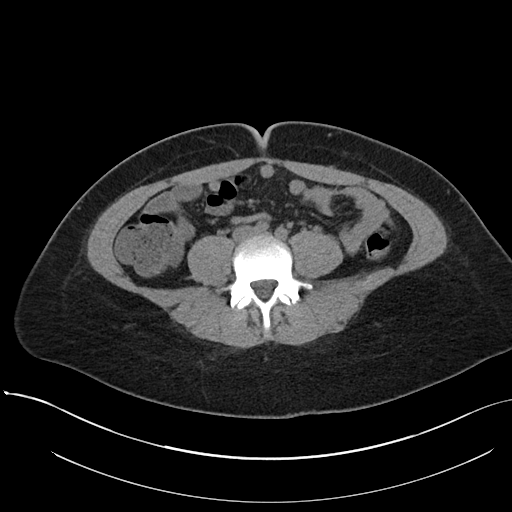
[im 45/97  soft-tissue]
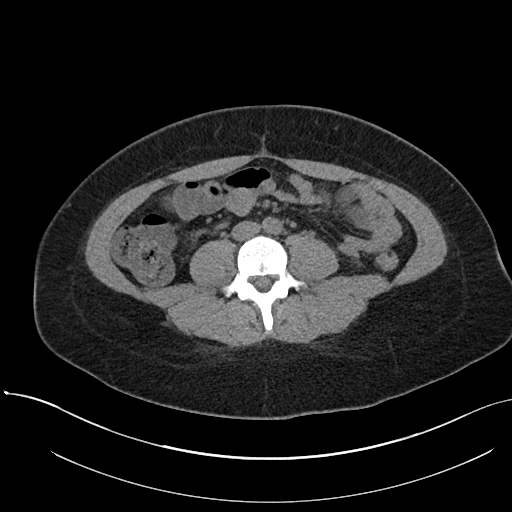
[im 53/97  soft-tissue]
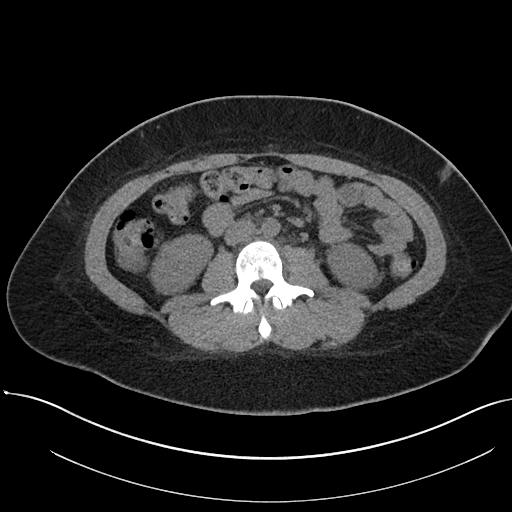
[im 57/97  soft-tissue]
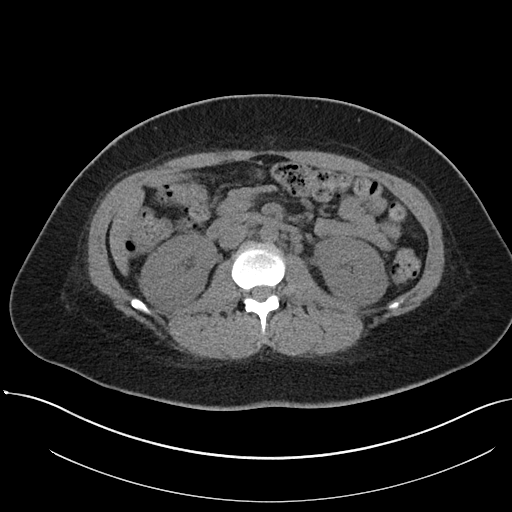
[im 57/97  bone]
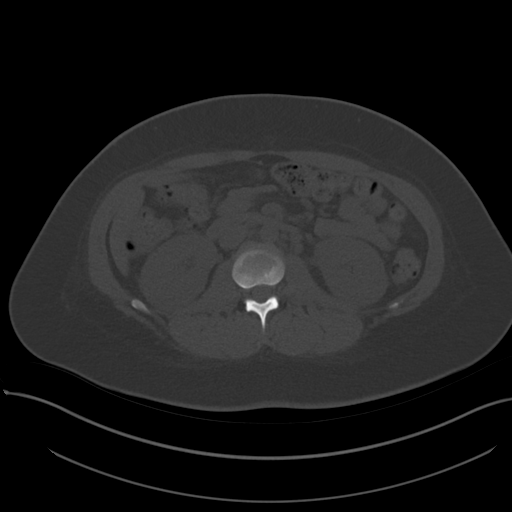
[im 65/97  soft-tissue]
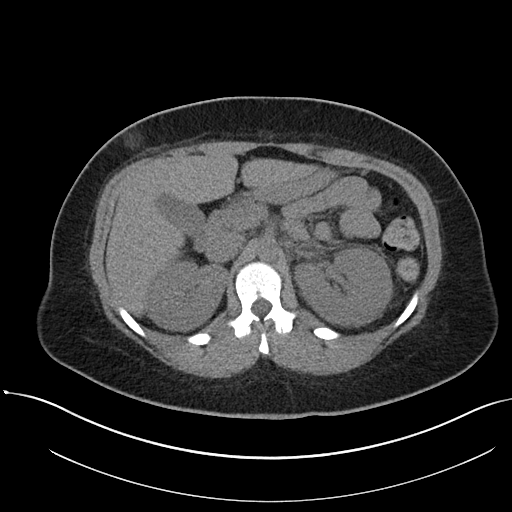
[im 73/97  soft-tissue]
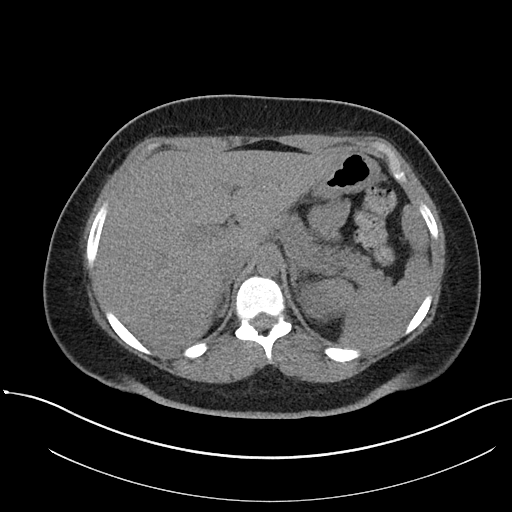
[im 77/97  soft-tissue]
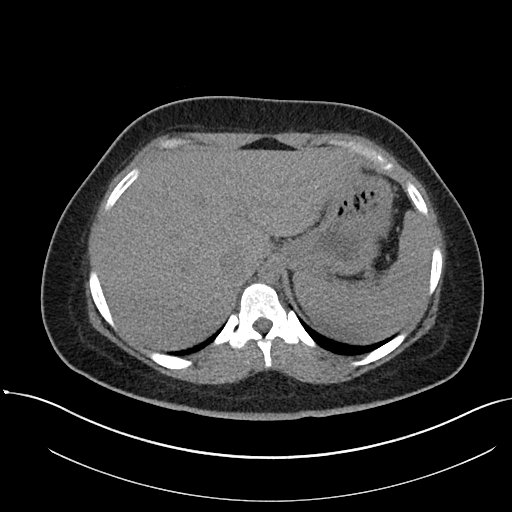
[im 85/97  soft-tissue]
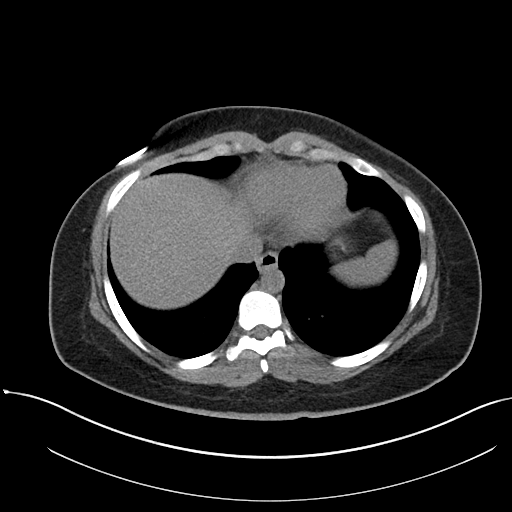
[im 93/97  soft-tissue]
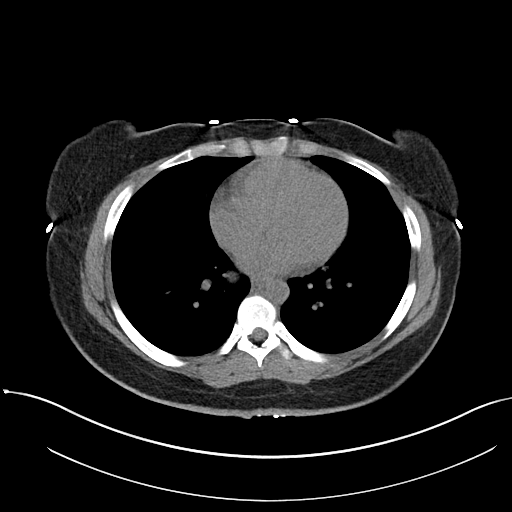

[Series 5: renal stone 3.0 cor · coronal · 0.86mm/px · 3 of 66 slices shown]
[im 22/66  soft-tissue]
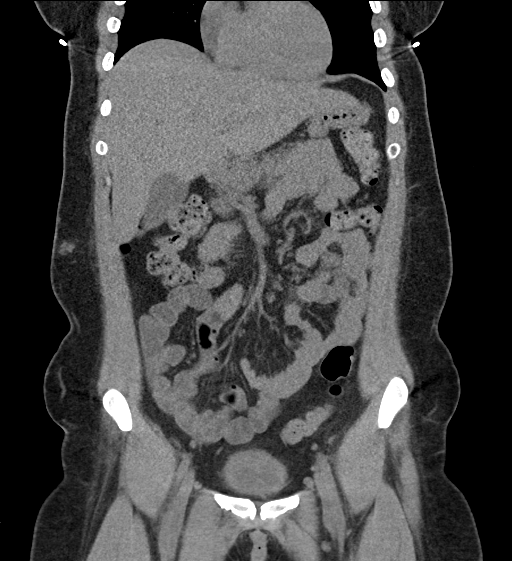
[im 29/66  soft-tissue]
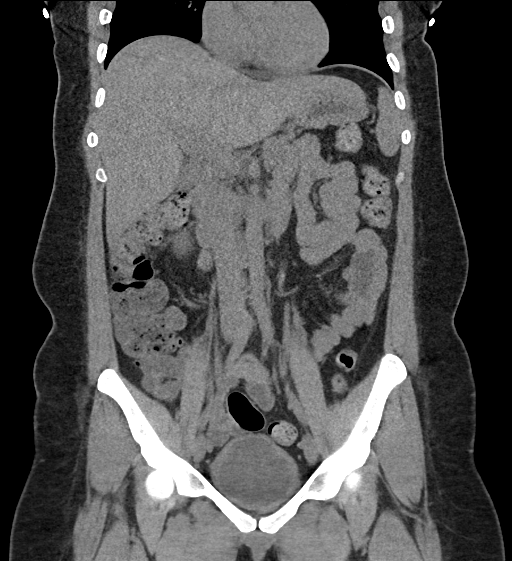
[im 37/66  soft-tissue]
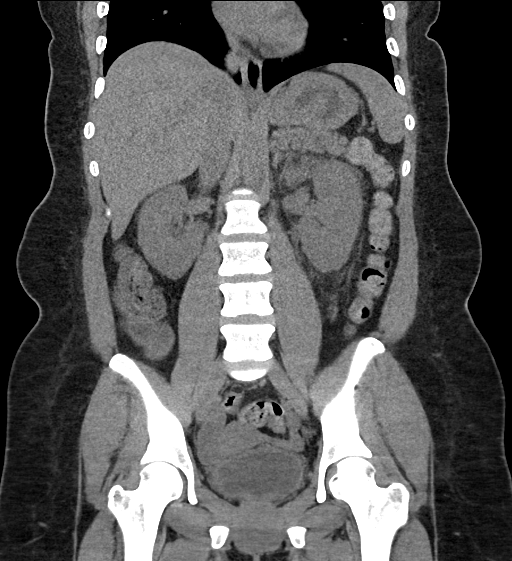

[17 of 46 positions shown; findings below may reference images not displayed]

FINDINGS: Lower chest: Lung bases are clear. No effusions. Heart is normal
size.

Hepatobiliary: No focal hepatic abnormality. Gallbladder
unremarkable.

Pancreas: No focal abnormality or ductal dilatation.

Spleen: No focal abnormality.  Normal size.

Adrenals/Urinary Tract: Mild fullness of the left renal collecting
system and perinephric stranding. No visible ureteral stones no
stones or hydronephrosis on the right. Adrenal glands are
unremarkable. Mild bladder wall thickening.

Stomach/Bowel: Appendix is normal. Stomach, large and small bowel
grossly unremarkable.

Vascular/Lymphatic: No evidence of aneurysm or adenopathy.

Reproductive: Uterus and adnexa unremarkable.  No mass.

Other: No free fluid or free air.

Musculoskeletal: No acute bony abnormality.
IMPRESSION: Perinephric stranding around the left kidney. Mild fullness of the
left renal collecting system without visible stones. There is mild
bladder wall thickening. Findings may reflect cystitis and
pyelonephritis. Recommend correlation with urinalysis.

## 2018-11-19 DIAGNOSIS — N760 Acute vaginitis: Secondary | ICD-10-CM | POA: Diagnosis not present

## 2018-11-22 ENCOUNTER — Telehealth: Payer: Self-pay

## 2018-11-22 NOTE — Telephone Encounter (Signed)
Pt letter was sent through her MyChart portal

## 2018-12-08 ENCOUNTER — Other Ambulatory Visit: Payer: Self-pay | Admitting: Family Medicine

## 2018-12-17 DIAGNOSIS — J0100 Acute maxillary sinusitis, unspecified: Secondary | ICD-10-CM | POA: Diagnosis not present

## 2018-12-25 ENCOUNTER — Telehealth: Payer: Self-pay

## 2018-12-25 ENCOUNTER — Ambulatory Visit: Payer: Self-pay | Admitting: *Deleted

## 2018-12-25 NOTE — Telephone Encounter (Signed)
  Reason for Disposition . MILD difficulty breathing (e.g., minimal/no SOB at rest, SOB with walking, pulse <100)  Answer Assessment - Initial Assessment Questions 1. COVID-19 DIAGNOSIS: "Who made your Coronavirus (COVID-19) diagnosis?" "Was it confirmed by a positive lab test?" If not diagnosed by a HCP, ask "Are there lots of cases (community spread) where you live?" (See public health department website, if unsure)     No confirmed diagnoses 2. ONSET: "When did the COVID-19 symptoms start?"      Diagnosed for sinusitis a while ago, nausea dn diarrhea since last thursday 3. WORST SYMPTOM: "What is your worst symptom?" (e.g., cough, fever, shortness of breath, muscle aches)    Diarrhea and nausea 4. COUGH: "Do you have a cough?" If so, ask: "How bad is the cough?"       Yes, non productive 5. FEVER: "Do you have a fever?" If so, ask: "What is your temperature, how was it measured, and when did it start?"     Temperature not checked but woke up with sweating 6. RESPIRATORY STATUS: "Describe your breathing?" (e.g., shortness of breath, wheezing, unable to speak)      Some shortness of breath 7. BETTER-SAME-WORSE: "Are you getting better, staying the same or getting worse compared to yesterday?"  If getting worse, ask, "In what way?"     Getting worse 8. HIGH RISK DISEASE: "Do you have any chronic medical problems?" (e.g., asthma, heart or lung disease, weak immune system, etc.)     diabetic 9. PREGNANCY: "Is there any chance you are pregnant?" "When was your last menstrual period?"     No 10. OTHER SYMPTOMS: "Do you have any other symptoms?"  (e.g., chills, fatigue, headache, loss of smell or taste, muscle pain, sore throat)      Tired, fatigue, waking up with sweating  Protocols used: CORONAVIRUS (COVID-19) DIAGNOSED OR SUSPECTED-A-AH

## 2018-12-25 NOTE — Telephone Encounter (Signed)
Pt calling stating that she had a free virtual visit with her insurance company, [facility] and testing for COVID-19 was recommended. Pt states that she went to [facility] and had antibody testing done but was later told that she should  have a mouth swab performed to test for COVID-19. Pt states she had a virtual visit previously and was treated for sinusitis that she has been experiencing for a while. Pt also has complaints of feeling tired, fatigued, non productive cough, runny and congested nose and wakes up sweating at times. Pt has not checked temperature. Pt also states she has been experiencing diarrhea and nausea since last Thursday that has been getting worse and she also has a loss of appetite. Pt denies any contact with any persons positive for COVID-19. Pt offered appt but wants to wait at this time to see if PCP would refer her for testing because she just had a free virtual visit with [facility]. Pt can be contacted at [phone]. Answer Assessment - Initial Assessment Questions 1. CLOSE CONTACT: "Who is the person with the confirmed or suspected COVID-19 infection that you were exposed to?"     N/a 2. PLACE of CONTACT: "Where were you when you were exposed to COVID-19?" (e.g., home, school, medical waiting room; which city?)     n/a 3. TYPE of CONTACT: "How much contact was there?" (e.g., sitting next to, live in same house, work in same office, same building)     n/a 4. DURATION of CONTACT: "How long were you in contact with the COVID-19 patient?" (e.g., a few seconds, passed by person, a few minutes, live with the patient)     n/a 5. DATE of CONTACT: "When did you have contact with a COVID-19 patient?" (e.g., how many days ago)     n/a 6. TRAVEL: "Have you traveled out of the country recently?" If so, "When and where?"     * Also ask about out-of-state travel, since the CDC has identified some high-risk cities for community spread in the [place].     * Note: Travel  becomes less relevant if there is widespread community transmission where the patient lives.     Travel to [place] 7. COMMUNITY SPREAD: "Are there lots of cases of COVID-19 (community spread) where you live?" (See public health department website, if unsure)       yes 8. SYMPTOMS: "Do you have any symptoms?" (e.g., fever, cough, breathing difficulty)     SOB at times, if I'm doing something I have to stop and catch my breath, but not with doing vigorous activity, dry coug 9. PREGNANCY OR POSTPARTUM: "Is there any chance you are pregnant?" "When was your last menstrual period?" "Did you deliver in the last 2 weeks?"     No but no menstrual since March 10. HIGH RISK: "Do you have any heart or lung problems? Do you have a weak immune system?" (e.g., CHF, COPD, asthma, HIV positive, chemotherapy, renal failure, diabetes mellitus, sickle cell anemia)       Diabetic  Protocols used: CORONAVIRUS (COVID-19) EXPOSURE-A-AH

## 2018-12-25 NOTE — Telephone Encounter (Signed)
Yevonne Pax, NP from [facility] called to request the patient to have a COVID test, she says the patient reported having a virtual visit with her provider today and was told to go to [place] for testing. Santiago Glad says the patient went to [facility] and had a antibody test, not a COVID test done. I advised of the virtual appointment tomorrow and that the provider will have to recommend the testing for this patient. She says the patient came to work and they sent her home until she get her results back from testing. She would like the results sent to her.  [place] Yevonne Pax, [place] [phone]

## 2018-12-25 NOTE — Telephone Encounter (Signed)
Pt has been scheduled a virtual appointment

## 2018-12-25 NOTE — Telephone Encounter (Signed)
Pt needs virtual visit with [facility].

## 2018-12-25 NOTE — Telephone Encounter (Signed)
noted 

## 2018-12-26 ENCOUNTER — Other Ambulatory Visit: Payer: Self-pay

## 2018-12-26 ENCOUNTER — Encounter: Payer: Self-pay | Admitting: Family Medicine

## 2018-12-26 ENCOUNTER — Ambulatory Visit (INDEPENDENT_AMBULATORY_CARE_PROVIDER_SITE_OTHER): Payer: 59 | Admitting: Family Medicine

## 2018-12-26 DIAGNOSIS — R6889 Other general symptoms and signs: Secondary | ICD-10-CM

## 2018-12-26 DIAGNOSIS — B349 Viral infection, unspecified: Secondary | ICD-10-CM | POA: Diagnosis not present

## 2018-12-26 DIAGNOSIS — E1165 Type 2 diabetes mellitus with hyperglycemia: Secondary | ICD-10-CM

## 2018-12-26 DIAGNOSIS — Z20822 Contact with and (suspected) exposure to covid-19: Secondary | ICD-10-CM

## 2018-12-26 NOTE — Progress Notes (Signed)
Virtual Visit via Video Note  I connected with Darlene Watts on 12/26/18 at  8:00 AM EDT by a video enabled telemedicine application and verified that I am speaking with the correct person using two identifiers.  Location patient: home Location provider:work or home office Persons participating in the virtual visit: patient, provider  I discussed the limitations of evaluation and management by telemedicine and the availability of in person appointments. The patient expressed understanding and agreed to proceed.   HPI: Pt is a 28 yo female with pmh sig for DM II, HSV, PCOS.    Pt states she was treated a few wks for sinusitis, but it has not cleared up.  Also having fatigue, dry cough, diarrhea, body aches, HAs, SOB with walkling short distances, decreased appetite, nausea, and 1-2 episodes of emesis x 1 wk..  Pt is unsure of fever, but has been awakened sweating profusely.   No sick contacts.  Took doxycycline for the sinsusitis, muscle relaxer, OTC cold/flu med, and ibuprofen.    Pt checking fsbs regularly,  220 the highest since being sick. Having trouble getting levemir, but taking a reduced dose of humalog TID.  States having to call the insurance company about the levemir.  ROS: See pertinent positives and negatives per HPI.  Past Medical History:  Diagnosis Date  . Diabetes mellitus    Type 2 - Novolog 70/30  . Herpes simplex type 2 infection March 2013  . Ketoacidosis   . PCOS (polycystic ovarian syndrome)   . Trichimoniasis   . Urinary tract infection     Past Surgical History:  Procedure Laterality Date  . NO PAST SURGERIES      Family History  Problem Relation Age of Onset  . Diabetes Mother   . Hypertension Mother   . Stroke Mother   . Heart attack Father   . Hearing loss Father   . COPD Father   . Arthritis Father     SOCIAL HX:    Current Outpatient Medications:  .  CYCLOBENZAPRINE HCL PO, Take 10 mg by mouth 2 (two) times daily., Disp: , Rfl:  .   insulin detemir (LEVEMIR) 100 UNIT/ML injection, Inject 60 Units into the skin at bedtime., Disp: , Rfl:  .  insulin lispro (HUMALOG) 100 UNIT/ML injection, Inject 10 Units into the skin 3 (three) times daily with meals., Disp: , Rfl:  .  loratadine (CLARITIN) 10 MG tablet, Take 10 mg by mouth daily as needed for allergies. OTC, Disp: , Rfl:  .  triamcinolone cream (KENALOG) 0.1 %, Apply 1 application topically 2 (two) times daily., Disp: 30 g, Rfl: 0 .  Vitamin D, Ergocalciferol, (DRISDOL) 1.25 MG (50000 UT) CAPS capsule, Take 1 capsule (50,000 Units total) by mouth every 7 (seven) days., Disp: 12 capsule, Rfl: 0  EXAM:  VITALS per patient if applicable: RR between 34-19 bpm.  GENERAL: alert, oriented, appears fatigued, sick but non toxic and in no acute distress  HEENT: atraumatic, conjunctiva clear, no obvious abnormalities on inspection of external nose and ears  NECK: normal movements of the head and neck  LUNGS: on inspection no signs of respiratory distress, breathing rate appears normal, no obvious gross SOB, gasping or wheezing  CV: no obvious cyanosis  MS: moves all visible extremities without noticeable abnormality  PSYCH/NEURO: pleasant and cooperative, no obvious depression or anxiety, speech and thought processing grossly intact  ASSESSMENT AND PLAN:  Discussed the following assessment and plan:  Viral illness -supportive care.  Will treat symptoms  at this time. -discussed the importance of staying hydrated given h/o DM -given precautions.  Suspected Covid-19 Virus Infection -COVID testing recommended. -pt given contact inf regarding COVID testing.   Uncontrolled type 2 diabetes mellitus with hyperglycemia ([place]) -fsbs 215 at last OFV and in the 200s at home. -pt advised to contact insurance company in regards to [place].  If they do not cover this med will write rx for another long-acting insulin -pt encouraged to stay hydrated given current illness and h/o  DKA -pt given precautions.  If symptoms or blood sugar become worse, pt to go to nearest ED.  F/u prn   I discussed the assessment and treatment plan with the patient. The patient was provided an opportunity to ask questions and all were answered. The patient agreed with the plan and demonstrated an understanding of the instructions.   The patient was advised to call back or seek an in-person evaluation if the symptoms worsen or if the condition fails to improve as anticipated.    Billie Ruddy, MD

## 2019-11-04 ENCOUNTER — Other Ambulatory Visit: Payer: Self-pay

## 2019-11-05 ENCOUNTER — Encounter: Payer: 59 | Admitting: Family Medicine

## 2021-04-06 ENCOUNTER — Emergency Department (HOSPITAL_COMMUNITY)
Admission: EM | Admit: 2021-04-06 | Discharge: 2021-04-07 | Disposition: A | Discharge status: Home / Self Care | Payer: 59 | Attending: Emergency Medicine | Admitting: Emergency Medicine

## 2021-04-06 ENCOUNTER — Encounter (HOSPITAL_COMMUNITY): Payer: Self-pay | Admitting: Emergency Medicine

## 2021-04-06 DIAGNOSIS — R519 Headache, unspecified: Secondary | ICD-10-CM | POA: Insufficient documentation

## 2021-04-06 DIAGNOSIS — R109 Unspecified abdominal pain: Secondary | ICD-10-CM | POA: Diagnosis not present

## 2021-04-06 DIAGNOSIS — E860 Dehydration: Secondary | ICD-10-CM | POA: Insufficient documentation

## 2021-04-06 DIAGNOSIS — Z20822 Contact with and (suspected) exposure to covid-19: Secondary | ICD-10-CM | POA: Insufficient documentation

## 2021-04-06 DIAGNOSIS — J101 Influenza due to other identified influenza virus with other respiratory manifestations: Secondary | ICD-10-CM | POA: Diagnosis not present

## 2021-04-06 DIAGNOSIS — N9489 Other specified conditions associated with female genital organs and menstrual cycle: Secondary | ICD-10-CM | POA: Insufficient documentation

## 2021-04-06 DIAGNOSIS — R197 Diarrhea, unspecified: Secondary | ICD-10-CM | POA: Insufficient documentation

## 2021-04-06 DIAGNOSIS — Z794 Long term (current) use of insulin: Secondary | ICD-10-CM | POA: Insufficient documentation

## 2021-04-06 DIAGNOSIS — R059 Cough, unspecified: Secondary | ICD-10-CM | POA: Insufficient documentation

## 2021-04-06 DIAGNOSIS — E1165 Type 2 diabetes mellitus with hyperglycemia: Secondary | ICD-10-CM | POA: Diagnosis not present

## 2021-04-06 DIAGNOSIS — R739 Hyperglycemia, unspecified: Secondary | ICD-10-CM

## 2021-04-06 DIAGNOSIS — M791 Myalgia, unspecified site: Secondary | ICD-10-CM | POA: Insufficient documentation

## 2021-04-06 DIAGNOSIS — R531 Weakness: Secondary | ICD-10-CM

## 2021-04-06 DIAGNOSIS — R1110 Vomiting, unspecified: Secondary | ICD-10-CM | POA: Diagnosis present

## 2021-04-06 LAB — COMPREHENSIVE METABOLIC PANEL
ALT: 29 U/L (ref 0–44)
AST: 28 U/L (ref 15–41)
Albumin: 3.6 g/dL (ref 3.5–5.0)
Alkaline Phosphatase: 44 U/L (ref 38–126)
Anion gap: 15 (ref 5–15)
BUN: 9 mg/dL (ref 6–20)
CO2: 20 mmol/L — ABNORMAL LOW (ref 22–32)
Calcium: 8.9 mg/dL (ref 8.9–10.3)
Chloride: 96 mmol/L — ABNORMAL LOW (ref 98–111)
Creatinine, Ser: 0.98 mg/dL (ref 0.44–1.00)
GFR, Estimated: >60 mL/min (ref >60)
Glucose, Bld: 355 mg/dL — ABNORMAL HIGH (ref 70–99)
Potassium: 4.9 mmol/L (ref 3.5–5.1)
Sodium: 131 mmol/L — ABNORMAL LOW (ref 135–145)
Total Bilirubin: 0.8 mg/dL (ref 0.3–1.2)
Total Protein: 7.5 g/dL (ref 6.5–8.1)

## 2021-04-06 LAB — CBC WITH DIFFERENTIAL/PLATELET
Abs Immature Granulocytes: 0 10*3/uL (ref 0.00–0.07)
Basophils Absolute: 0.1 10*3/uL (ref 0.0–0.1)
Basophils Relative: 3 %
Eosinophils Absolute: 0 10*3/uL (ref 0.0–0.5)
Eosinophils Relative: 0 %
HCT: 42.9 % (ref 36.0–46.0)
Hemoglobin: 13.7 g/dL (ref 12.0–15.0)
Lymphocytes Relative: 37 %
Lymphs Abs: 1.4 10*3/uL (ref 0.7–4.0)
MCH: 26 pg (ref 26.0–34.0)
MCHC: 31.9 g/dL (ref 30.0–36.0)
MCV: 81.6 fL (ref 80.0–100.0)
Monocytes Absolute: 0.2 10*3/uL (ref 0.1–1.0)
Monocytes Relative: 6 %
Neutro Abs: 2.1 10*3/uL (ref 1.7–7.7)
Neutrophils Relative %: 54 %
Platelets: 215 10*3/uL (ref 150–400)
RBC: 5.26 MIL/uL — ABNORMAL HIGH (ref 3.87–5.11)
RDW: 13 % (ref 11.5–15.5)
WBC: 3.9 10*3/uL — ABNORMAL LOW (ref 4.0–10.5)
nRBC: 0 % (ref 0.0–0.2)
nRBC: 1 /100 WBC — ABNORMAL HIGH

## 2021-04-06 LAB — RESP PANEL BY RT-PCR (FLU A&B, COVID) ARPGX2
Influenza A by PCR: POSITIVE — AB
Influenza B by PCR: NEGATIVE
SARS Coronavirus 2 by RT PCR: NEGATIVE

## 2021-04-06 LAB — LIPASE, BLOOD: Lipase: 27 U/L (ref 11–51)

## 2021-04-06 LAB — I-STAT BETA HCG BLOOD, ED (MC, WL, AP ONLY): I-stat hCG, quantitative: <5 m[IU]/mL (ref <5)

## 2021-04-06 NOTE — ED Provider Notes (Signed)
Emergency Medicine Provider Triage Evaluation Note  Darlene Watts' Darlene Watts , a 30 y.o. female  was evaluated in triage.  Pt complains of fatigue, nasal congestion, rhinorrhea, cough, nausea, vomiting, epigastric abdominal pain and diarrhea.  Patient reports that her symptoms started on Friday and have gotten progressively worse since then.  Patient reports that she is having difficulty keeping down p.o. intake.  Patient reports that she has vomited 7 times in last 24 hours.  Patient describes emesis as bilious and stomach contents.  Patient has been vaccinated for COVID-19.  No known sick contacts.  Patient is currently on her menstrual period.  Review of Systems  Positive: fatigue, nasal congestion, rhinorrhea, cough, nausea, vomiting, epigastric abdominal pain and diarrhea, chills Negative: Fever, hematemesis, coffee-ground emesis, blood in stool, melena, dysuria, hematuria, vaginal pain, vaginal discharge  Physical Exam  BP (!) 121/93 (BP Location: Right Arm)   Pulse 96   Temp 98.4 F (36.9 C) (Oral)   Resp 18   SpO2 100%  Gen:   Awake, no distress   Resp:  Normal effort, lungs clear to auscultation bilaterally MSK:   Moves extremities without difficulty  Other:  Abdomen soft, nondistended, nontender.  Medical Decision Making  Medically screening exam initiated at 2:06 PM.  Appropriate orders placed.  Verita Schneiders' [place] was informed that the remainder of the evaluation will be completed by another provider, this initial triage assessment does not replace that evaluation, and the importance of remaining in the ED until their evaluation is complete.  The patient appears stable so that the remainder of the work up may be completed by another provider.      Loni Beckwith, PA-C 04/06/21 1407    Elnora Morrison, MD 04/07/21 [phone]

## 2021-04-06 NOTE — ED Triage Notes (Signed)
Patient complains of generalized weakness and fatigue starting last Saturday. Patient states since she has been unwell she has not had an appetite and has not been taking her insulin. Patient reports having been hospitalized for DKA in the past and states she feels similar today as when she was admitted before. Patient alert and oriented at this time. No tachypnea nor kussmaul's respirations noted.

## 2021-04-07 LAB — URINALYSIS, ROUTINE W REFLEX MICROSCOPIC
Bilirubin Urine: NEGATIVE
Glucose, UA: >=500 mg/dL — AB
Ketones, ur: 80 mg/dL — AB
Nitrite: NEGATIVE
Protein, ur: 30 mg/dL — AB
RBC / HPF: >50 RBC/hpf — ABNORMAL HIGH (ref 0–5)
Specific Gravity, Urine: 1.009 (ref 1.005–1.030)
pH: 6 (ref 5.0–8.0)

## 2021-04-07 LAB — CBG MONITORING, ED
Glucose-Capillary: 277 mg/dL — ABNORMAL HIGH (ref 70–99)
Glucose-Capillary: 264 mg/dL — ABNORMAL HIGH (ref 70–99)

## 2021-04-07 MED ORDER — LACTATED RINGERS IV BOLUS
2000.0000 mL | Freq: Once | INTRAVENOUS | Status: AC
  Administered 2021-04-07: 2000 mL via INTRAVENOUS

## 2021-04-07 NOTE — ED Notes (Signed)
Patient verbalizes understanding of discharge instructions. Opportunity for questioning and answers were provided. Armband removed by staff, pt discharged from ED and ambulated to lobby to return home.   

## 2021-04-07 NOTE — ED Provider Notes (Signed)
[facility] EMERGENCY DEPARTMENT Provider Note   CSN: [id number] Arrival date & time: 04/06/21  1340     History Chief Complaint  Patient presents with   Fatigue    Darlene Watts is a 30 y.o. female.  The history is provided by the patient.  Emesis Severity:  Moderate Timing:  Intermittent Progression:  Worsening Chronicity:  New Relieved by:  Nothing Worsened by:  Nothing Associated symptoms: abdominal pain, chills, cough, diarrhea, headaches and myalgias   Associated symptoms: no fever   Patient reports over 6 days ago she began having cough She then began developing headache which was followed by vomiting and diarrhea.  She reports multiple episodes of both.  She also reports associated abdominal cramping.  She reports fatigue.  No recorded fevers.  She is a diabetic but does not have an insulin pump in place    Past Medical History:  Diagnosis Date   Diabetes mellitus    Type 2 - Novolog 70/30   Herpes simplex type 2 infection March 2013   Ketoacidosis    PCOS (polycystic ovarian syndrome)    Trichimoniasis    Urinary tract infection     Patient Active Problem List   Diagnosis Date Noted   Pyelonephritis 03/08/2017   Nausea 03/08/2017   Hyponatremia 03/08/2017   Microcytic anemia 03/08/2017   Yeast vaginitis 03/08/2017   Cystitis 03/08/2017   Type 2 diabetes mellitus with ketoacidosis without coma ([place]) 11/10/2014   Type 2 diabetes mellitus with hyperglycemia ([place]) 10/30/2014    Past Surgical History:  Procedure Laterality Date   NO PAST SURGERIES       OB History   No obstetric history on file.     Family History  Problem Relation Age of Onset   Diabetes Mother    Hypertension Mother    Stroke Mother    Heart attack Father    Hearing loss Father    COPD Father    Arthritis Father     Social History   Tobacco Use   Smoking status: Never   Smokeless tobacco: Never  Substance Use Topics   Alcohol use: No    Drug use: No    Home Medications Prior to Admission medications   Medication Sig Start Date End Date Taking? Authorizing Provider  CYCLOBENZAPRINE HCL PO Take 10 mg by mouth 2 (two) times daily.    [provider]  insulin detemir (LEVEMIR) 100 UNIT/ML injection Inject 60 Units into the skin at bedtime.    [provider]  insulin lispro (HUMALOG) 100 UNIT/ML injection Inject 10 Units into the skin 3 (three) times daily with meals.    [provider]  loratadine (CLARITIN) 10 MG tablet Take 10 mg by mouth daily as needed for allergies. OTC    [provider]  triamcinolone cream (KENALOG) 0.1 % Apply 1 application topically 2 (two) times daily. 08/16/18   Isaac Bliss, Rayford Halsted, MD  Vitamin D, Ergocalciferol, (DRISDOL) 1.25 MG (50000 UT) CAPS capsule Take 1 capsule (50,000 Units total) by mouth every 7 (seven) days. 09/13/18   Billie Ruddy, MD    Allergies    Amoxicillin, Bydureon [exenatide], and Macrobid [nitrofurantoin]  Review of Systems   Review of Systems  Constitutional:  Positive for chills and fatigue. Negative for fever.  Respiratory:  Positive for cough.   Gastrointestinal:  Positive for abdominal pain, diarrhea, nausea and vomiting.  Musculoskeletal:  Positive for myalgias.  Neurological:  Positive for headaches.  All other  systems reviewed and are negative.  Physical Exam Updated Vital Signs BP 126/90   Pulse 75   Temp 98.3 F (36.8 C) (Oral)   Resp 16   SpO2 100%   Physical Exam CONSTITUTIONAL: Well developed/well nourished HEAD: Normocephalic/atraumatic EYES: EOMI/PERRL ENMT: Mucous membranes moist NECK: supple no meningeal signs SPINE/BACK:entire spine nontender CV: S1/S2 noted, no murmurs/rubs/gallops noted LUNGS: Lungs are clear to auscultation bilaterally, no apparent distress ABDOMEN: soft, nontender, no rebound or guarding, bowel sounds noted throughout abdomen GU:no cva tenderness NEURO: Pt is  awake/alert/appropriate, moves all extremitiesx4.  No facial droop.   EXTREMITIES: pulses normal/equal, full ROM SKIN: warm, color normal PSYCH: no abnormalities of mood noted, alert and oriented to situation  ED Results / Procedures / Treatments   Labs (all labs ordered are listed, but only abnormal results are displayed) Labs Reviewed  RESP PANEL BY RT-PCR (FLU A&B, COVID) ARPGX2 - Abnormal; Notable for the following components:      Result Value   Influenza A by PCR POSITIVE (*)    All other components within normal limits  COMPREHENSIVE METABOLIC PANEL - Abnormal; Notable for the following components:   Sodium 131 (*)    Chloride 96 (*)    CO2 20 (*)    Glucose, Bld 355 (*)    All other components within normal limits  CBC WITH DIFFERENTIAL/PLATELET - Abnormal; Notable for the following components:   WBC 3.9 (*)    RBC 5.26 (*)    nRBC 1 (*)    All other components within normal limits  CBG MONITORING, ED - Abnormal; Notable for the following components:   Glucose-Capillary 277 (*)    All other components within normal limits  CBG MONITORING, ED - Abnormal; Notable for the following components:   Glucose-Capillary 264 (*)    All other components within normal limits  URINE CULTURE  LIPASE, BLOOD  URINALYSIS, ROUTINE W REFLEX MICROSCOPIC  I-STAT BETA HCG BLOOD, ED ([facility], [facility], [facility] ONLY)    EKG None  Radiology No results found.  Procedures Procedures   Medications Ordered in ED Medications  lactated ringers bolus 2,000 mL (0 mLs Intravenous Stopped 04/07/21 [id number])    ED Course  I have reviewed the triage vital signs and the nursing notes.  Pertinent labs results that were available during my care of the patient were reviewed by me and considered in my medical decision making (see chart for details).    MDM Rules/Calculators/A&P                           Patient history of diabetes presents with cough, vomiting and diarrhea.  She is found to be positive for  influenza A.  Her symptoms have been ongoing for up to 6 days, so Tamiflu is not indicated She is hyperglycemic without anion gap.  Will give IV fluids and reassess 7:04 AM Patient feels improved.  She is taking p.o. fluids. No signs of DKA. She will be discharged home  Final Clinical Impression(s) / ED Diagnoses Final diagnoses:  Weakness  Dehydration  Hyperglycemia  Influenza A    Rx / DC Orders ED Discharge Orders     None        Ripley Fraise, MD 04/07/21 [phone]

## 2021-04-07 NOTE — ED Notes (Signed)
Pt able to walk with a steady gait to the RR

## 2021-04-07 NOTE — ED Notes (Signed)
Pt able to tolerate PO fluids.  

## 2021-04-07 NOTE — ED Notes (Signed)
Pt given PO Fluids per MD [place].

## 2021-04-08 LAB — URINE CULTURE
Culture: >=100000 — AB
Special Requests: NORMAL

## 2021-04-10 ENCOUNTER — Telehealth: Payer: Self-pay | Admitting: Emergency Medicine

## 2021-04-10 NOTE — Telephone Encounter (Signed)
Post ED Visit - Positive Culture Follow-up  Culture report reviewed by antimicrobial stewardship pharmacist: [place] Team '[]'$  Elenor Quinones, Pharm.D. '[]'$  Heide Guile, Pharm.D., BCPS AQ-ID '[]'$  Parks Neptune, Pharm.D., BCPS '[]'$  Alycia Rossetti, Pharm.D., BCPS '[]'$  [place], Pharm.D., BCPS, AAHIVP '[]'$  Legrand Como, Pharm.D., BCPS, AAHIVP '[x]'$  Salome Arnt, PharmD, BCPS '[]'$  Johnnette Gourd, PharmD, BCPS '[]'$  Hughes Better, PharmD, BCPS '[]'$  Leeroy Cha, PharmD '[]'$  Laqueta Linden, PharmD, BCPS '[]'$  Albertina Parr, PharmD  [place] Team '[]'$  Leodis Sias, PharmD '[]'$  Lindell Spar, PharmD '[]'$  Royetta Asal, PharmD '[]'$  Graylin Shiver, Rph '[]'$  [name]) [name], PharmD '[]'$  Arlyn Dunning, PharmD '[]'$  Netta Cedars, PharmD '[]'$  Dia Sitter, PharmD '[]'$  Leone Haven, PharmD '[]'$  Gretta Arab, PharmD '[]'$  Theodis Shove, PharmD '[]'$  Peggyann Juba, PharmD '[]'$  Reuel Boom, PharmD   Positive urine culture No further patient follow-up is required at this time.  [name] Darlene Watts 04/10/2021, 11:36 AM

## 2022-01-02 ENCOUNTER — Ambulatory Visit: Payer: 59 | Admitting: Internal Medicine

## 2022-03-28 ENCOUNTER — Other Ambulatory Visit: Payer: Self-pay | Admitting: Nurse Practitioner

## 2022-03-28 ENCOUNTER — Ambulatory Visit
Admission: RE | Admit: 2022-03-28 | Discharge: 2022-03-28 | Disposition: A | Discharge status: Home / Self Care | Payer: No Typology Code available for payment source | Source: Ambulatory Visit | Attending: Nurse Practitioner | Admitting: Nurse Practitioner

## 2022-03-28 DIAGNOSIS — M25562 Pain in left knee: Secondary | ICD-10-CM

## 2022-03-28 DIAGNOSIS — M25511 Pain in right shoulder: Secondary | ICD-10-CM

## 2022-03-28 DIAGNOSIS — M25579 Pain in unspecified ankle and joints of unspecified foot: Secondary | ICD-10-CM

## 2022-03-28 DIAGNOSIS — M25561 Pain in right knee: Secondary | ICD-10-CM

## 2022-03-28 DIAGNOSIS — M542 Cervicalgia: Secondary | ICD-10-CM

## 2022-08-15 ENCOUNTER — Other Ambulatory Visit: Payer: Self-pay | Admitting: Urology

## 2022-08-15 DIAGNOSIS — D134 Benign neoplasm of liver: Secondary | ICD-10-CM

## 2022-09-12 ENCOUNTER — Ambulatory Visit
Admission: RE | Admit: 2022-09-12 | Discharge: 2022-09-12 | Disposition: A | Discharge status: Home / Self Care | Payer: 59 | Source: Ambulatory Visit | Attending: Urology | Admitting: Urology

## 2022-09-12 DIAGNOSIS — D134 Benign neoplasm of liver: Secondary | ICD-10-CM

## 2022-09-12 MED ORDER — GADOPICLENOL 0.5 MMOL/ML IV SOLN
10.0000 mL | Freq: Once | INTRAVENOUS | Status: AC | PRN
  Administered 2022-09-12: 10 mL via INTRAVENOUS
